# Patient Record
Sex: Female | Born: 2000 | Race: White | Hispanic: No | Marital: Single | Smoking: Current some day smoker
Health system: Southern US, Community
[De-identification: ages and names within clinical notes are randomized; demographics above are authoritative.]

## PROBLEM LIST (undated history)

## (undated) DIAGNOSIS — J302 Other seasonal allergic rhinitis: Secondary | ICD-10-CM

## (undated) DIAGNOSIS — R41840 Attention and concentration deficit: Secondary | ICD-10-CM

## (undated) HISTORY — PX: NO PAST SURGERIES: SHX2092

## (undated) HISTORY — PX: MOUTH SURGERY: SHX715

## (undated) HISTORY — DX: Other seasonal allergic rhinitis: J30.2

## (undated) HISTORY — DX: Attention and concentration deficit: R41.840

---
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
## 2020-04-27 ENCOUNTER — Other Ambulatory Visit: Payer: Self-pay | Admitting: Internal Medicine

## 2020-04-28 ENCOUNTER — Other Ambulatory Visit: Payer: Self-pay | Admitting: Internal Medicine

## 2020-05-04 ENCOUNTER — Other Ambulatory Visit: Payer: Self-pay | Admitting: Internal Medicine

## 2020-05-22 ENCOUNTER — Other Ambulatory Visit: Payer: Self-pay | Admitting: Internal Medicine

## 2020-05-25 ENCOUNTER — Other Ambulatory Visit: Payer: Self-pay | Admitting: Internal Medicine

## 2020-06-01 ENCOUNTER — Other Ambulatory Visit: Payer: Self-pay | Admitting: Internal Medicine

## 2020-06-02 ENCOUNTER — Ambulatory Visit: Payer: Self-pay | Admitting: Internal Medicine

## 2020-06-02 ENCOUNTER — Other Ambulatory Visit: Payer: Self-pay

## 2020-06-02 ENCOUNTER — Encounter: Payer: Self-pay | Admitting: Internal Medicine

## 2020-06-02 VITALS — BP 110/68 | HR 65 | Temp 98.2°F | Ht 66.75 in | Wt 148.4 lb

## 2020-06-02 DIAGNOSIS — Z79899 Other long term (current) drug therapy: Secondary | ICD-10-CM

## 2020-06-02 DIAGNOSIS — R41840 Attention and concentration deficit: Secondary | ICD-10-CM

## 2020-06-02 DIAGNOSIS — Z0000 Encounter for general adult medical examination without abnormal findings: Secondary | ICD-10-CM

## 2020-06-02 DIAGNOSIS — F419 Anxiety disorder, unspecified: Secondary | ICD-10-CM

## 2020-06-02 DIAGNOSIS — Z3041 Encounter for surveillance of contraceptive pills: Secondary | ICD-10-CM

## 2020-06-02 MED ORDER — LISDEXAMFETAMINE DIMESYLATE 10 MG PO CAPS: 10.0000 mg | ORAL_CAPSULE | Freq: Every day | ORAL | 0 refills | Status: DC

## 2020-06-02 MED ORDER — CITALOPRAM HYDROBROMIDE 10 MG PO TABS: 10.0000 mg | ORAL_TABLET | Freq: Every day | ORAL | 1 refills | Status: DC

## 2020-06-02 MED ORDER — DROSPIRENONE-ETHINYL ESTRADIOL 3-0.02 MG PO TABS: 1.0000 | ORAL_TABLET | Freq: Every day | ORAL | 3 refills | Status: DC

## 2020-06-02 MED ORDER — FLUTICASONE PROPIONATE 50 MCG/ACT NA SUSP: 2.0000 | Freq: Every day | NASAL | 2 refills | Status: DC

## 2020-06-02 NOTE — Progress Notes (Signed)
Chief Complaint  Patient presents with   Medication Management    Discuss med changes, wants 3 months Rx at a time   Annual Exam    HPI: Patient  Shelly Fox  19 y.o. comes in today for Preventive Health Care visit  And med check    ADD?  Would like to try 40 mg    30 not as effective .      Would like to try 40 mg  . School.  Beginning and to transfer to [employer] helped but was not as effective near end of last semester .  Denies sig se  Anxiety worry  Father asks her to address her anxiety chornic worry and  Some irrirability  Tends to get fearful at night  Someone may  Enter her abode   No hx of same  . Tends to have some anxieyt not sure about counseling  ( disc in past when evaluated by add eval ) working as  [employer] goes to gym still has hard time getting to sleep    Neg tad   Denise full panic attacks  Not suicidal   Refill ocps   Doing well nl periods  No sti concerns  Has perioroial rawsh at times using sis eczema cream? Not sure  What   Health Maintenance  Topic Date Due   Hepatitis C Screening  Never done   HIV Screening  Never done   TETANUS/TDAP  Never done   INFLUENZA VACCINE  06/28/2020   Health Maintenance Review LIFESTYLE:  Exercise:  Goes to gyme Tneg tad Sugar beverages: Sleep:sometimes  Less ocass ha  Drug use: no  Work: [employer]  In summer  soph in fall biology     ROS: ocass hasa  GEN/ HEENT: No fever, significant weight changes sweats  vision problems hearing changes, CV/ PULM; No chest pain shortness of breath cough, syncope,edema  change in exercise tolerance. GI /GU: No adominal pain, vomiting, change in bowel habits. No blood in the stool. No significant GU symptoms. SKIN/HEME: ,no acute skin rashes suspicious lesions or bleeding. No lymphadenopathy, nodules, masses.  NEURO/ PSYCH:  No neurologic signs such as weakness numbness. Not really depressed see hpi IMM/ Allergy: No unusual infections.  Allergy .   REST of 12 system  review negative except as per HPI   Past Medical History:  Diagnosis Date   Attention and concentration deficit    eval for add poss ocd and hyperfocus failed med trial in elem school   Seasonal allergies    rhinitis on meds     Past Surgical History:  Procedure Laterality Date   NO PAST SURGERIES      Family History  Problem Relation Age of Onset   Hyperlipidemia Father    Asthma Father    Allergies Father    Allergies Mother    Alcohol abuse Maternal Grandfather     Social History   Socioeconomic History   Marital status: Single    Spouse name: Not on file   Number of children: Not on file   Years of education: Not on file   Highest education level: Not on file  Occupational History   Occupation: Student  Tobacco Use   Smoking status: Never Smoker   Smokeless tobacco: Never Used  Vaping Use   Vaping Use: Former  Substance and Sexual Activity   Alcohol use: Yes    Alcohol/week: 0.0 standard drinks    Comment: Occassional   Drug  use: Never   Sexual activity: Not Currently  Other Topics Concern   Not on file  Social History Narrative   [employer] to go to [employer]   Parents divorced 2 HH   Parents james Robenson mzsers Press photographer rep aed and Fish farm manager college realestate   stepdad Marjory Lies Zseltvay  Step mom Andreika Vandagriff( a legal guardian reported)   Week trnasfers mom hh has 2 dogs net ets FA    Older sis   Social Determinants of Health   Financial Resource Strain:    Difficulty of Paying Living Expenses:   Food Insecurity:    Worried About Charity fundraiser in the Last Year:    Arboriculturist in the Last Year:   Transportation Needs:    Film/video editor (Medical):    Lack of Transportation (Non-Medical):   Physical Activity:    Days of Exercise per Week:    Minutes of Exercise per Session:   Stress:    Feeling of Stress :   Social Connections:    Frequency of Communication with Friends and Family:     Frequency of Social Gatherings with Friends and Family:    Attends Religious Services:    Active Member of Clubs or Organizations:    Attends Archivist Meetings:    Marital Status:     Outpatient Medications Prior to Visit  Medication Sig Dispense Refill   ibuprofen (ADVIL,MOTRIN) 800 MG tablet as needed.     cetirizine (ZYRTEC) 10 MG tablet Take 1 tablet (10 mg total) by mouth daily. Please schedule follow up visit for further refills. [phone] 30 tablet 0   drospirenone-ethinyl estradiol (NIKKI) 3-0.02 MG tablet Take 1 tablet by mouth daily. Please keep upcoming appointments for refills. 28 tablet 0   fluticasone (FLONASE) 50 MCG/ACT nasal spray INSTIL 1 SPRAY IN EACH NOSTRIL DAILY 1 g 2   lisdexamfetamine (VYVANSE) 30 MG capsule Take 1 capsule (30 mg total) by mouth daily. 90 capsule 0   No facility-administered medications prior to visit.     EXAM:  BP 110/68    Pulse 65    Temp 98.2 F (36.8 C) (Temporal)    Ht 5' 6.75" (1.695 m)    Wt 148 lb 6.4 oz (67.3 kg)    SpO2 98%    BMI 23.42 kg/m   Body mass index is 23.42 kg/m. Wt Readings from Last 3 Encounters:  06/02/20 148 lb 6.4 oz (67.3 kg) (80 %, Z= 0.84)*  05/29/19 145 lb (65.8 kg) (80 %, Z= 0.83)*  10/17/18 138 lb 11.2 oz (62.9 kg) (75 %, Z= 0.67)*   * Growth percentiles are based on CDC (Girls, 2-20 Years) data.    Physical Exam: Vital signs reviewed HER:DEYC is a well-developed well-nourished alert cooperative    who appearsr stated age in no acute distress.  HEENT: normocephalic atraumatic , Eyes: PERRL EOM's full, conjunctiva clear, Nares: paten,t no deformity discharge or tenderness., Ears: no deformity EAC's clear TMs with normal landmarks. Mouth: clear OP, masked NECK: supple without masses, thyromegaly or bruits. CHEST/PULM:  Clear to auscultation and percussion breath sounds equal no wheeze , rales or rhonchi. No chest wall deformities or tenderness. Breast: normal by inspection . No  dimpling, discharge, masses, tenderness or discharge . CV: PMI is nondisplaced, S1 S2 no gallops, murmurs, rubs. Peripheral pulses are full without delay.No JVD .  ABDOMEN: Bowel sounds normal nontender  No guard or rebound, no hepato splenomegal no CVA tenderness.  Extremtities:  No clubbing cyanosis or edema, no acute joint swelling or redness no focal atrophy NEURO:  Oriented x3, cranial nerves 3-12 appear to be intact, no obvious focal weakness,gait within normal limits no abnormal reflexes or asymmetrical SKIN: No acute rashes normal turgor, color, no bruising or petechiae. Few papules around  Mouth nl area no scaling  PSYCH: Oriented, good eye contact, no obvious depression anxiety, cognition and judgment appear normal. Nl speech  LN: no cervical axillary inguinal adenopathy  Lab Results  Component Value Date   WBC 7.5 05/29/2019   HGB 12.6 05/29/2019   HCT 37.7 05/29/2019   PLT 246.0 05/29/2019   GLUCOSE 83 05/29/2019   CHOL 159 05/29/2019   TRIG 125.0 05/29/2019   HDL 53.90 05/29/2019   LDLCALC 80 05/29/2019   ALT 15 05/29/2019   AST 29 05/29/2019   NA 138 05/29/2019   K 4.6 05/29/2019   CL 104 05/29/2019   CREATININE 0.95 05/29/2019   BUN 12 05/29/2019   CO2 26 05/29/2019   TSH 2.12 05/29/2019    BP Readings from Last 3 Encounters:  06/02/20 110/68  05/29/19 110/60  10/17/18 (!) 102/64      ASSESSMENT AND PLAN:  Discussed the following assessment and plan:    ICD-10-CM   1. Visit for preventive health examination  Z00.00   2. Medication management  Z79.899   3. Attention and concentration deficit  R41.840   4. Anxiety disorder, unspecified type  F41.9   5. Oral contraceptive use  Z30.41    anxiety   A problem still consider  medication   Family    counseling at some point has limited insurance at this time  Disc counseling [facility] in future  Plan add 10 citalopram  And VV fu in about 4 weeks   Can try adding 10 of vyvanse to the 30 total 40 mg dose  ing  May  benefit when at work  Only add one med at a time. Refill ocps and flonase  hcm seems utd  ( ?last td)  Not in our records  But assume utd as  Passes immuniz clearance at college and hs)  No follow-ups on file.  Patient Care Team: Geniya Fulgham, Standley Brooking, MD as PCP - General (Internal Medicine) Patient Instructions  consider  Adding med and counseling about   Anxiety .  Anxiety Can effect sleep etc.  Can add  Low dose medication for  The anxiety  May help sleep eventually.   Citalopram  And fu in 4 weeks   Can try  vyvanse  40 mg equivalent .   10 mg   Add on to try   Sleep is important for  Mood and other .           Generalized Anxiety Disorder, Adult Generalized anxiety disorder (GAD) is a mental health disorder. People with this condition constantly worry about everyday events. Unlike normal anxiety, worry related to GAD is not triggered by a specific event. These worries also do not fade or get better with time. GAD interferes with life functions, including relationships, work, and school. GAD can vary from mild to severe. People with severe GAD can have intense waves of anxiety with physical symptoms (panic attacks). What are the causes? The exact cause of GAD is not known. What increases the risk? This condition is more likely to develop in:  Women.  People who have a family history of anxiety disorders.  People who are very shy.  People who  experience very stressful life events, such as the death of a loved one.  People who have a very stressful family environment. What are the signs or symptoms? People with GAD often worry excessively about many things in their lives, such as their health and family. They may also be overly concerned about:  Doing well at work.  Being on time.  Natural disasters.  Friendships. Physical symptoms of GAD include:  Fatigue.  Muscle tension or having muscle twitches.  Trembling or feeling shaky.  Being easily  startled.  Feeling like your heart is pounding or racing.  Feeling out of breath or like you cannot take a deep breath.  Having trouble falling asleep or staying asleep.  Sweating.  Nausea, diarrhea, or irritable bowel syndrome (IBS).  Headaches.  Trouble concentrating or remembering facts.  Restlessness.  Irritability. How is this diagnosed? Your health care provider can diagnose GAD based on your symptoms and medical history. You will also have a physical exam. The health care provider will ask specific questions about your symptoms, including how severe they are, when they started, and if they come and go. Your health care provider may ask you about your use of alcohol or drugs, including prescription medicines. Your health care provider may refer you to a mental health specialist for further evaluation. Your health care provider will do a thorough examination and may perform additional tests to rule out other possible causes of your symptoms. To be diagnosed with GAD, a person must have anxiety that:  Is out of his or her control.  Affects several different aspects of his or her life, such as work and relationships.  Causes distress that makes him or her unable to take part in normal activities.  Includes at least three physical symptoms of GAD, such as restlessness, fatigue, trouble concentrating, irritability, muscle tension, or sleep problems. Before your health care provider can confirm a diagnosis of GAD, these symptoms must be present more days than they are not, and they must last for six months or longer. How is this treated? The following therapies are usually used to treat GAD:  Medicine. Antidepressant medicine is usually prescribed for long-term daily control. Antianxiety medicines may be added in severe cases, especially when panic attacks occur.  Talk therapy (psychotherapy). Certain types of talk therapy can be helpful in treating GAD by providing support,  education, and guidance. Options include: ? Cognitive behavioral therapy (CBT). People learn coping skills and techniques to ease their anxiety. They learn to identify unrealistic or negative thoughts and behaviors and to replace them with positive ones. ? Acceptance and commitment therapy (ACT). This treatment teaches people how to be mindful as a way to cope with unwanted thoughts and feelings. ? Biofeedback. This process trains you to manage your body's response (physiological response) through breathing techniques and relaxation methods. You will work with a therapist while machines are used to monitor your physical symptoms.  Stress management techniques. These include yoga, meditation, and exercise. A mental health specialist can help determine which treatment is best for you. Some people see improvement with one type of therapy. However, other people require a combination of therapies. Follow these instructions at home:  Take over-the-counter and prescription medicines only as told by your health care provider.  Try to maintain a normal routine.  Try to anticipate stressful situations and allow extra time to manage them.  Practice any stress management or self-calming techniques as taught by your health care provider.  Do not punish yourself for  setbacks or for not making progress.  Try to recognize your accomplishments, even if they are small.  Keep all follow-up visits as told by your health care provider. This is important. Contact a health care provider if:  Your symptoms do not get better.  Your symptoms get worse.  You have signs of depression, such as: ? A persistently sad, cranky, or irritable mood. ? Loss of enjoyment in activities that used to bring you joy. ? Change in weight or eating. ? Changes in sleeping habits. ? Avoiding friends or family members. ? Loss of energy for normal tasks. ? Feelings of guilt or worthlessness. Get help right away if:  You have  serious thoughts about hurting yourself or others. If you ever feel like you may hurt yourself or others, or have thoughts about taking your own life, get help right away. You can go to your nearest emergency department or call:  Your local emergency services (911 in the [place]  A suicide crisis helpline, such as the [place] at 931-075-1319. This is open 24 hours a day. Summary  Generalized anxiety disorder (GAD) is a mental health disorder that involves worry that is not triggered by a specific event.  People with GAD often worry excessively about many things in their lives, such as their health and family.  GAD may cause physical symptoms such as restlessness, trouble concentrating, sleep problems, frequent sweating, nausea, diarrhea, headaches, and trembling or muscle twitching.  A mental health specialist can help determine which treatment is best for you. Some people see improvement with one type of therapy. However, other people require a combination of therapies. This information is not intended to replace advice given to you by your health care provider. Make sure you discuss any questions you have with your health care provider. Document Revised: 10/27/2017 Document Reviewed: 10/04/2016 Elsevier Patient Education  2020 [place] 59-9 Years Old, Female Preventive care refers to lifestyle choices and visits with your health care provider that can promote health and wellness. At this stage in your life, you may start seeing a primary care physician instead of a pediatrician. Your health care is now your responsibility. Preventive care for young adults includes:  A yearly physical exam. This is also called an annual wellness visit.  Regular dental and eye exams.  Immunizations.  Screening for certain conditions.  Healthy lifestyle choices, such as diet and exercise. What can I expect for my preventive care visit? Physical  exam Your health care provider may check:  Height and weight. These may be used to calculate body mass index (BMI), which is a measurement that tells if you are at a healthy weight.  Heart rate and blood pressure.  Body temperature. Counseling Your health care provider may ask you questions about:  Past medical problems and family medical history.  Alcohol, tobacco, and drug use.  Home and relationship well-being.  Access to firearms.  Emotional well-being.  Diet, exercise, and sleep habits.  Sexual activity and sexual health.  Method of birth control.  Menstrual cycle.  Pregnancy history. What immunizations do I need?  Influenza (flu) vaccine  This is recommended every year. Tetanus, diphtheria, and pertussis (Tdap) vaccine  You may need a Td booster every 10 years. Varicella (chickenpox) vaccine  You may need this vaccine if you have not already been vaccinated. Human papillomavirus (HPV) vaccine  If recommended by your health care provider, you may need three doses over 6 months. Measles, mumps,  and rubella (MMR) vaccine  You may need at least one dose of MMR. You may also need a second dose. Meningococcal conjugate (MenACWY) vaccine  One dose is recommended if you are 18-23 years old and a Market researcher living in a residence hall, or if you have one of several medical conditions. You may also need additional booster doses. Pneumococcal conjugate (PCV13) vaccine  You may need this if you have certain conditions and were not previously vaccinated. Pneumococcal polysaccharide (PPSV23) vaccine  You may need one or two doses if you smoke cigarettes or if you have certain conditions. Hepatitis A vaccine  You may need this if you have certain conditions or if you travel or work in places where you may be exposed to hepatitis A. Hepatitis B vaccine  You may need this if you have certain conditions or if you travel or work in places where you may  be exposed to hepatitis B. Haemophilus influenzae type b (Hib) vaccine  You may need this if you have certain risk factors. You may receive vaccines as individual doses or as more than one vaccine together in one shot (combination vaccines). Talk with your health care provider about the risks and benefits of combination vaccines. What tests do I need? Blood tests  Lipid and cholesterol levels. These may be checked every 5 years starting at age 3.  Hepatitis C test.  Hepatitis B test. Screening  Pelvic exam and Pap test. This may be done every 3 years starting at age 46.  Sexually transmitted disease (STD) testing, if you are at risk.  BRCA-related cancer screening. This may be done if you have a family history of breast, ovarian, tubal, or peritoneal cancers. Other tests  Tuberculosis skin test.  Vision and hearing tests.  Skin exam.  Breast exam. Follow these instructions at home: Eating and drinking   Eat a diet that includes fresh fruits and vegetables, whole grains, lean protein, and low-fat dairy products.  Drink enough fluid to keep your urine pale yellow.  Do not drink alcohol if: ? Your health care provider tells you not to drink. ? You are pregnant, may be pregnant, or are planning to become pregnant. ? You are under the legal drinking age. In the [place] the legal drinking age is 41.  If you drink alcohol: ? Limit how much you have to 0-1 drink a day. ? Be aware of how much alcohol is in your drink. In the [place] one drink equals one 12 oz bottle of beer (355 mL), one 5 oz glass of wine (148 mL), or one 1 oz glass of hard liquor (44 mL). Lifestyle  Take daily care of your teeth and gums.  Stay active. Exercise at least 30 minutes 5 or more days of the week.  Do not use any products that contain nicotine or tobacco, such as cigarettes, e-cigarettes, and chewing tobacco. If you need help quitting, ask your health care provider.  Do not use drugs.  If you  are sexually active, practice safe sex. Use a condom or other form of birth control (contraception) in order to prevent pregnancy and STIs (sexually transmitted infections). If you plan to become pregnant, see your health care provider for a pre-conception visit.  Find healthy ways to cope with stress, such as: ? Meditation, yoga, or listening to music. ? Journaling. ? Talking to a trusted person. ? Spending time with friends and family. Safety  Always wear your seat belt while driving or riding in a  vehicle.  Do not drive if you have been drinking alcohol. Do not ride with someone who has been drinking.  Do not drive when you are tired or distracted. Do not text while driving.  Wear a helmet and other protective equipment during sports activities.  If you have firearms in your house, make sure you follow all gun safety procedures.  Seek help if you have been bullied, physically abused, or sexually abused.  Use the Internet responsibly to avoid dangers such as online bullying and online sex predators. What's next?  Go to your health care provider once a year for a well check visit.  Ask your health care provider how often you should have your eyes and teeth checked.  Stay up to date on all vaccines. This information is not intended to replace advice given to you by your health care provider. Make sure you discuss any questions you have with your health care provider. Document Revised: 11/08/2018 Document Reviewed: 11/08/2018 Elsevier Patient Education  2020 [place] Marysol Wellnitz M.D.

## 2020-06-02 NOTE — Patient Instructions (Addendum)
consider  Adding med and counseling about   Anxiety .  Anxiety Can effect sleep etc.  Can add  Low dose medication for  The anxiety  May help sleep eventually.   Citalopram  And fu in 4 weeks   Can try  vyvanse  40 mg equivalent .   10 mg   Add on to try   Sleep is important for  Mood and other .           Generalized Anxiety Disorder, Adult Generalized anxiety disorder (GAD) is a mental health disorder. People with this condition constantly worry about everyday events. Unlike normal anxiety, worry related to GAD is not triggered by a specific event. These worries also do not fade or get better with time. GAD interferes with life functions, including relationships, work, and school. GAD can vary from mild to severe. People with severe GAD can have intense waves of anxiety with physical symptoms (panic attacks). What are the causes? The exact cause of GAD is not known. What increases the risk? This condition is more likely to develop in:  Women.  People who have a family history of anxiety disorders.  People who are very shy.  People who experience very stressful life events, such as the death of a loved one.  People who have a very stressful family environment. What are the signs or symptoms? People with GAD often worry excessively about many things in their lives, such as their health and family. They may also be overly concerned about:  Doing well at work.  Being on time.  Natural disasters.  Friendships. Physical symptoms of GAD include:  Fatigue.  Muscle tension or having muscle twitches.  Trembling or feeling shaky.  Being easily startled.  Feeling like your heart is pounding or racing.  Feeling out of breath or like you cannot take a deep breath.  Having trouble falling asleep or staying asleep.  Sweating.  Nausea, diarrhea, or irritable bowel syndrome (IBS).  Headaches.  Trouble concentrating or remembering  facts.  Restlessness.  Irritability. How is this diagnosed? Your health care provider can diagnose GAD based on your symptoms and medical history. You will also have a physical exam. The health care provider will ask specific questions about your symptoms, including how severe they are, when they started, and if they come and go. Your health care provider may ask you about your use of alcohol or drugs, including prescription medicines. Your health care provider may refer you to a mental health specialist for further evaluation. Your health care provider will do a thorough examination and may perform additional tests to rule out other possible causes of your symptoms. To be diagnosed with GAD, a person must have anxiety that:  Is out of his or her control.  Affects several different aspects of his or her life, such as work and relationships.  Causes distress that makes him or her unable to take part in normal activities.  Includes at least three physical symptoms of GAD, such as restlessness, fatigue, trouble concentrating, irritability, muscle tension, or sleep problems. Before your health care provider can confirm a diagnosis of GAD, these symptoms must be present more days than they are not, and they must last for six months or longer. How is this treated? The following therapies are usually used to treat GAD:  Medicine. Antidepressant medicine is usually prescribed for long-term daily control. Antianxiety medicines may be added in severe cases, especially when panic attacks occur.  Talk therapy (psychotherapy). Certain types  of talk therapy can be helpful in treating GAD by providing support, education, and guidance. Options include: ? Cognitive behavioral therapy (CBT). People learn coping skills and techniques to ease their anxiety. They learn to identify unrealistic or negative thoughts and behaviors and to replace them with positive ones. ? Acceptance and commitment therapy (ACT).  This treatment teaches people how to be mindful as a way to cope with unwanted thoughts and feelings. ? Biofeedback. This process trains you to manage your body's response (physiological response) through breathing techniques and relaxation methods. You will work with a therapist while machines are used to monitor your physical symptoms.  Stress management techniques. These include yoga, meditation, and exercise. A mental health specialist can help determine which treatment is best for you. Some people see improvement with one type of therapy. However, other people require a combination of therapies. Follow these instructions at home:  Take over-the-counter and prescription medicines only as told by your health care provider.  Try to maintain a normal routine.  Try to anticipate stressful situations and allow extra time to manage them.  Practice any stress management or self-calming techniques as taught by your health care provider.  Do not punish yourself for setbacks or for not making progress.  Try to recognize your accomplishments, even if they are small.  Keep all follow-up visits as told by your health care provider. This is important. Contact a health care provider if:  Your symptoms do not get better.  Your symptoms get worse.  You have signs of depression, such as: ? A persistently sad, cranky, or irritable mood. ? Loss of enjoyment in activities that used to bring you joy. ? Change in weight or eating. ? Changes in sleeping habits. ? Avoiding friends or family members. ? Loss of energy for normal tasks. ? Feelings of guilt or worthlessness. Get help right away if:  You have serious thoughts about hurting yourself or others. If you ever feel like you may hurt yourself or others, or have thoughts about taking your own life, get help right away. You can go to your nearest emergency department or call:  Your local emergency services (911 in the [place]  A suicide crisis  helpline, such as the [facility] at 215-353-0249. This is open 24 hours a day. Summary  Generalized anxiety disorder (GAD) is a mental health disorder that involves worry that is not triggered by a specific event.  People with GAD often worry excessively about many things in their lives, such as their health and family.  GAD may cause physical symptoms such as restlessness, trouble concentrating, sleep problems, frequent sweating, nausea, diarrhea, headaches, and trembling or muscle twitching.  A mental health specialist can help determine which treatment is best for you. Some people see improvement with one type of therapy. However, other people require a combination of therapies. This information is not intended to replace advice given to you by your health care provider. Make sure you discuss any questions you have with your health care provider. Document Revised: 10/27/2017 Document Reviewed: 10/04/2016 Elsevier Patient Education  2020 [place] 38-48 Years Old, Female Preventive care refers to lifestyle choices and visits with your health care provider that can promote health and wellness. At this stage in your life, you may start seeing a primary care physician instead of a pediatrician. Your health care is now your responsibility. Preventive care for young adults includes:  A yearly physical exam. This is also called  an annual wellness visit.  Regular dental and eye exams.  Immunizations.  Screening for certain conditions.  Healthy lifestyle choices, such as diet and exercise. What can I expect for my preventive care visit? Physical exam Your health care provider may check:  Height and weight. These may be used to calculate body mass index (BMI), which is a measurement that tells if you are at a healthy weight.  Heart rate and blood pressure.  Body temperature. Counseling Your health care provider may ask you questions  about:  Past medical problems and family medical history.  Alcohol, tobacco, and drug use.  Home and relationship well-being.  Access to firearms.  Emotional well-being.  Diet, exercise, and sleep habits.  Sexual activity and sexual health.  Method of birth control.  Menstrual cycle.  Pregnancy history. What immunizations do I need?  Influenza (flu) vaccine  This is recommended every year. Tetanus, diphtheria, and pertussis (Tdap) vaccine  You may need a Td booster every 10 years. Varicella (chickenpox) vaccine  You may need this vaccine if you have not already been vaccinated. Human papillomavirus (HPV) vaccine  If recommended by your health care provider, you may need three doses over 6 months. Measles, mumps, and rubella (MMR) vaccine  You may need at least one dose of MMR. You may also need a second dose. Meningococcal conjugate (MenACWY) vaccine  One dose is recommended if you are 59-89 years old and a Market researcher living in a residence hall, or if you have one of several medical conditions. You may also need additional booster doses. Pneumococcal conjugate (PCV13) vaccine  You may need this if you have certain conditions and were not previously vaccinated. Pneumococcal polysaccharide (PPSV23) vaccine  You may need one or two doses if you smoke cigarettes or if you have certain conditions. Hepatitis A vaccine  You may need this if you have certain conditions or if you travel or work in places where you may be exposed to hepatitis A. Hepatitis B vaccine  You may need this if you have certain conditions or if you travel or work in places where you may be exposed to hepatitis B. Haemophilus influenzae type b (Hib) vaccine  You may need this if you have certain risk factors. You may receive vaccines as individual doses or as more than one vaccine together in one shot (combination vaccines). Talk with your health care provider about the risks and  benefits of combination vaccines. What tests do I need? Blood tests  Lipid and cholesterol levels. These may be checked every 5 years starting at age 58.  Hepatitis C test.  Hepatitis B test. Screening  Pelvic exam and Pap test. This may be done every 3 years starting at age 1.  Sexually transmitted disease (STD) testing, if you are at risk.  BRCA-related cancer screening. This may be done if you have a family history of breast, ovarian, tubal, or peritoneal cancers. Other tests  Tuberculosis skin test.  Vision and hearing tests.  Skin exam.  Breast exam. Follow these instructions at home: Eating and drinking   Eat a diet that includes fresh fruits and vegetables, whole grains, lean protein, and low-fat dairy products.  Drink enough fluid to keep your urine pale yellow.  Do not drink alcohol if: ? Your health care provider tells you not to drink. ? You are pregnant, may be pregnant, or are planning to become pregnant. ? You are under the legal drinking age. In the [place] the legal drinking age is  21.  If you drink alcohol: ? Limit how much you have to 0-1 drink a day. ? Be aware of how much alcohol is in your drink. In the [place] one drink equals one 12 oz bottle of beer (355 mL), one 5 oz glass of wine (148 mL), or one 1 oz glass of hard liquor (44 mL). Lifestyle  Take daily care of your teeth and gums.  Stay active. Exercise at least 30 minutes 5 or more days of the week.  Do not use any products that contain nicotine or tobacco, such as cigarettes, e-cigarettes, and chewing tobacco. If you need help quitting, ask your health care provider.  Do not use drugs.  If you are sexually active, practice safe sex. Use a condom or other form of birth control (contraception) in order to prevent pregnancy and STIs (sexually transmitted infections). If you plan to become pregnant, see your health care provider for a pre-conception visit.  Find healthy ways to cope with  stress, such as: ? Meditation, yoga, or listening to music. ? Journaling. ? Talking to a trusted person. ? Spending time with friends and family. Safety  Always wear your seat belt while driving or riding in a vehicle.  Do not drive if you have been drinking alcohol. Do not ride with someone who has been drinking.  Do not drive when you are tired or distracted. Do not text while driving.  Wear a helmet and other protective equipment during sports activities.  If you have firearms in your house, make sure you follow all gun safety procedures.  Seek help if you have been bullied, physically abused, or sexually abused.  Use the Internet responsibly to avoid dangers such as online bullying and online sex predators. What's next?  Go to your health care provider once a year for a well check visit.  Ask your health care provider how often you should have your eyes and teeth checked.  Stay up to date on all vaccines. This information is not intended to replace advice given to you by your health care provider. Make sure you discuss any questions you have with your health care provider. Document Revised: 11/08/2018 Document Reviewed: 11/08/2018 Elsevier Patient Education  2020 [employer].

## 2020-06-30 ENCOUNTER — Other Ambulatory Visit: Payer: Self-pay

## 2020-07-01 MED ORDER — CETIRIZINE HCL 10 MG PO TABS: 10.0000 mg | ORAL_TABLET | Freq: Every day | ORAL | 0 refills | Status: DC

## 2020-07-06 ENCOUNTER — Encounter: Payer: Self-pay | Admitting: Internal Medicine

## 2020-07-06 ENCOUNTER — Telehealth (INDEPENDENT_AMBULATORY_CARE_PROVIDER_SITE_OTHER): Payer: Self-pay | Admitting: Internal Medicine

## 2020-07-06 ENCOUNTER — Other Ambulatory Visit: Payer: Self-pay

## 2020-07-06 VITALS — Ht 66.75 in | Wt 145.0 lb

## 2020-07-06 DIAGNOSIS — F419 Anxiety disorder, unspecified: Secondary | ICD-10-CM

## 2020-07-06 DIAGNOSIS — Z79899 Other long term (current) drug therapy: Secondary | ICD-10-CM

## 2020-07-06 DIAGNOSIS — R41840 Attention and concentration deficit: Secondary | ICD-10-CM

## 2020-07-06 MED ORDER — CITALOPRAM HYDROBROMIDE 10 MG PO TABS: 10.0000 mg | ORAL_TABLET | Freq: Every day | ORAL | 1 refills | Status: DC

## 2020-07-06 NOTE — Progress Notes (Signed)
Virtual Visit via Video Note  I connected with@ on 07/06/20 at  9:00 AM EDT by a video enabled telemedicine application and verified that I am speaking with the correct person using two identifiers. Location patient: home Location provider:work  office Persons participating in the virtual visit: patient, provider  [place] national recommendations  regarding COVID 19 pandemic   video visit is advised over in office visit for this patient.  Patient aware  of the limitations of evaluation and management by telemedicine and  availability of in person appointments. and agreed to proceed.   HPI: Shelly Fox presents for video visit  Has begun citalopram 10 mg and she and family feels has been helping a good bit  Sleeping better and not as irritable andanxisous  No panic attacks  stoppe ft woprk last weekk To  Move in school in next week [employer]   Not taking vyvanse at this time but plans on taking  At school or "when needed" needng to get a lot of stugg done.   Would try the 30 mg first and then inc to 40 mg  As trial as we discussed last visit    ROS: See pertinent positives and negatives per HPI.  Past Medical History:  Diagnosis Date  . Attention and concentration deficit    eval for add poss ocd and hyperfocus failed med trial in elem school  . Seasonal allergies    rhinitis on meds     Past Surgical History:  Procedure Laterality Date  . NO PAST SURGERIES      Family History  Problem Relation Age of Onset  . Hyperlipidemia Father   . Asthma Father   . Allergies Father   . Allergies Mother   . Alcohol abuse Maternal Grandfather     Social History   Tobacco Use  . Smoking status: Never Smoker  . Smokeless tobacco: Never Used  Vaping Use  . Vaping Use: Former  Substance Use Topics  . Alcohol use: Yes    Alcohol/week: 0.0 standard drinks    Comment: Occassional  . Drug use: Never      Current Outpatient Medications:  .  cetirizine (ZYRTEC) 10 MG tablet, Take  1 tablet (10 mg total) by mouth daily., Disp: 90 tablet, Rfl: 0 .  citalopram (CELEXA) 10 MG tablet, Take 1 tablet (10 mg total) by mouth daily., Disp: 90 tablet, Rfl: 1 .  drospirenone-ethinyl estradiol (NIKKI) 3-0.02 MG tablet, Take 1 tablet by mouth daily., Disp: 84 tablet, Rfl: 3 .  fluticasone (FLONASE) 50 MCG/ACT nasal spray, Place 2 sprays into both nostrils daily., Disp: 16 g, Rfl: 2 .  ibuprofen (ADVIL,MOTRIN) 800 MG tablet, as needed., Disp: , Rfl:  .  lisdexamfetamine (VYVANSE) 10 MG capsule, Take 1 capsule (10 mg total) by mouth daily. With 30 mg total 40 mg, Disp: 30 capsule, Rfl: 0 .  lisdexamfetamine (VYVANSE) 30 MG capsule, Take 1 capsule (30 mg total) by mouth daily., Disp: 90 capsule, Rfl: 0  EXAM: BP Readings from Last 3 Encounters:  06/02/20 110/68  05/29/19 110/60  10/17/18 (!) 102/64    GENERAL: alert, oriented, appears well and in no acute distress  HEENT: atraumatic, conjunttiva clear, no obvious abnormalities on inspection of external nose and ears  NECK: normal movements of the head and neck  LUNGS: on inspection no signs of respiratory distress, breathing rate appears normal, no obvious gross SOB, gasping or wheezing  CV: no obvious cyanosis  MS: moves all visible extremities without noticeable abnormality  PSYCH/NEURO: pleasant and cooperative, no obvious depression or anxiety, speech and thought processing grossly intact   ASSESSMENT AND PLAN:  Discussed the following assessment and plan:    ICD-10-CM   1. Anxiety disorder, unspecified type  F41.9    doing better on medication   2. Medication management  Z79.899   3. Attention and concentration deficit  R41.840    medicaion helpful  Stay at same dose  10 mg    Can refill at 90 days  When due . Ok to take the vyvanse 30  And add on 10 as trial if needed during school year (lock up meds ) send [place] progress about how doing   Plan fu visit vv or other in 3-6 months or sooner if needed  consider  checking into student health insurance if  Better coverage and pricing  Counseled.  Good luck in school   Expectant management and discussion of plan and treatment with opportunity to ask questions and all were answered. The patient agreed with the plan and demonstrated an understanding of the instructions.   Advised to call back or seek an in-person evaluation if worsening  or having  further concerns . Return for 3-6 months or as indicated  med check .   Berniece Andreas, MD

## 2020-07-30 ENCOUNTER — Other Ambulatory Visit: Payer: Self-pay | Admitting: Internal Medicine

## 2020-07-30 ENCOUNTER — Other Ambulatory Visit: Payer: Self-pay | Admitting: *Deleted

## 2020-07-30 DIAGNOSIS — F419 Anxiety disorder, unspecified: Secondary | ICD-10-CM

## 2020-07-30 MED ORDER — CITALOPRAM HYDROBROMIDE 10 MG PO TABS: 10.0000 mg | ORAL_TABLET | Freq: Every day | ORAL | 1 refills | Status: DC

## 2020-07-31 ENCOUNTER — Other Ambulatory Visit: Payer: Self-pay

## 2020-07-31 ENCOUNTER — Telehealth: Payer: Self-pay | Admitting: Internal Medicine

## 2020-07-31 DIAGNOSIS — F419 Anxiety disorder, unspecified: Secondary | ICD-10-CM

## 2020-07-31 MED ORDER — CITALOPRAM HYDROBROMIDE 10 MG PO TABS: 10.0000 mg | ORAL_TABLET | Freq: Every day | ORAL | 1 refills | Status: DC

## 2020-07-31 NOTE — Telephone Encounter (Signed)
Cali from [facility] pharmacy is saying they have no record for the pt's medication   citalopram (CELEXA) 10 MG tablet   To please resend or call the prescription in   [facility] [address], [place] - [address]  [address] [place], [place] [address]  Phone:  [phone] Fax:  [phone]   Please advise

## 2020-07-31 NOTE — Telephone Encounter (Signed)
Medication looks like it was sent yesterday to the pharmacy but upon further inspection Asher Muir had it marked as fill later and not on the normal to send electronically.  I have just sent this medication to the pharmacy the correct way.

## 2020-08-31 ENCOUNTER — Telehealth: Payer: Self-pay | Admitting: Internal Medicine

## 2020-08-31 MED ORDER — LISDEXAMFETAMINE DIMESYLATE 40 MG PO CAPS: 40.0000 mg | ORAL_CAPSULE | ORAL | 0 refills | Status: DC

## 2020-08-31 NOTE — Addendum Note (Signed)
Addended byBerniece Andreas K on: 08/31/2020 01:27 PM   Modules accepted: Orders

## 2020-08-31 NOTE — Telephone Encounter (Addendum)
I sent in  40 mg vyvanse .  Let us know  How helping  Before runs out Can make virtual for med check

## 2020-08-31 NOTE — Telephone Encounter (Signed)
Last OV was 07/06/2020

## 2020-08-31 NOTE — Telephone Encounter (Signed)
Patient needs a refill on lisdexamfetamine (VYVANSE) 30 MG capsule   She would like to increase to 40 mg and a 3 months supply sent to the pharmacy since she's I college  [facility] [address], [place] - [address] Phone:  [phone]  Fax:  [phone]

## 2020-09-02 NOTE — Telephone Encounter (Signed)
Called patient and she stated that the 40 mg is working very well for her and she will call back to schedule her virtual med management appointment.

## 2020-09-16 ENCOUNTER — Telehealth (INDEPENDENT_AMBULATORY_CARE_PROVIDER_SITE_OTHER): Payer: Self-pay | Admitting: Internal Medicine

## 2020-09-16 ENCOUNTER — Encounter: Payer: Self-pay | Admitting: Internal Medicine

## 2020-09-16 DIAGNOSIS — Z3041 Encounter for surveillance of contraceptive pills: Secondary | ICD-10-CM

## 2020-09-16 DIAGNOSIS — R41840 Attention and concentration deficit: Secondary | ICD-10-CM

## 2020-09-16 DIAGNOSIS — Z79899 Other long term (current) drug therapy: Secondary | ICD-10-CM

## 2020-09-16 DIAGNOSIS — F419 Anxiety disorder, unspecified: Secondary | ICD-10-CM

## 2020-09-16 NOTE — Progress Notes (Signed)
Virtual Visit via Video Note  I connected with@ on 09/16/20 at 11:30 AM EDT by a video enabled telemedicine application and verified that I am speaking with the correct person using two identifiers. Location patient: home school  Location provider:work  office Persons participating in the virtual visit: patient, provider  [place] national recommendations  regarding COVID 19 pandemic   video visit is advised over in office visit for this patient.  Patient aware  of the limitations of evaluation and management by telemedicine and  availability of in person appointments. and agreed to proceed.   HPI: Shelly Fox presents for video visit  Fu  meds   Feels doing much better   Anxiety    Med has been helpful for thinking and irritability  School    Intense but doing well likes her classes   Add sx  Helping a lot   Helping  With the vyvanse 40 mg  No sig untoward se  Sleep ok    Not as much time exercising but ok.    ROS: See pertinent positives and negatives per HPI.  Past Medical History:  Diagnosis Date  . Attention and concentration deficit    eval for add poss ocd and hyperfocus failed med trial in elem school  . Seasonal allergies    rhinitis on meds     Past Surgical History:  Procedure Laterality Date  . NO PAST SURGERIES      Family History  Problem Relation Age of Onset  . Hyperlipidemia Father   . Asthma Father   . Allergies Father   . Allergies Mother   . Alcohol abuse Maternal Grandfather     Social History   Tobacco Use  . Smoking status: Never Smoker  . Smokeless tobacco: Never Used  Vaping Use  . Vaping Use: Former  Substance Use Topics  . Alcohol use: Yes    Alcohol/week: 0.0 standard drinks    Comment: Occassional  . Drug use: Never      Current Outpatient Medications:  .  cetirizine (ZYRTEC) 10 MG tablet, Take 1 tablet (10 mg total) by mouth daily., Disp: 90 tablet, Rfl: 0 .  citalopram (CELEXA) 10 MG tablet, Take 1 tablet (10 mg total) by  mouth daily., Disp: 90 tablet, Rfl: 1 .  drospirenone-ethinyl estradiol (NIKKI) 3-0.02 MG tablet, Take 1 tablet by mouth daily., Disp: 84 tablet, Rfl: 3 .  fluticasone (FLONASE) 50 MCG/ACT nasal spray, Place 2 sprays into both nostrils daily., Disp: 16 g, Rfl: 2 .  ibuprofen (ADVIL,MOTRIN) 800 MG tablet, as needed., Disp: , Rfl:  .  lisdexamfetamine (VYVANSE) 40 MG capsule, Take 1 capsule (40 mg total) by mouth every morning., Disp: 90 capsule, Rfl: 0  EXAM: BP Readings from Last 3 Encounters:  06/02/20 110/68  05/29/19 110/60  10/17/18 (!) 102/64    VITALS per patient if applicable:  GENERAL: alert, oriented, appears well and in no acute distress HEENT: atraumatic, conjunttiva clear, no obvious abnormalities on inspection of external nose and ears NECK: normal movements of the head and neck LUNGS: on inspection no signs of respiratory distress, breathing rate appears normal, no obvious gross SOB, gasping or wheezing CV: no obvious cyanosisMS: moves all visible extremities without noticeable abnormality  PSYCH/NEURO: pleasant and cooperative, no obvious depression or anxiety, speech and thought processing grossly intact   ASSESSMENT AND PLAN:  Discussed the following assessment and plan:    ICD-10-CM   1. Medication management  Z79.899   2. Anxiety disorder, unspecified type  F41.9   3. Attention and concentration deficit  R41.840   4. Oral contraceptive pill surveillance  Z30.41    Doing quite well  Now   Continue meds    Counseled. Healthy  lsi Disc ocps and mood effect doubt significant   Effect for her in this situation   Since doing well was on for irreg periods and bleeding  And no contraceptive need for now  Would wait to consider   Trying off and benefit more than risk at this time  Expectant management and discussion of plan and treatment with opportunity to ask questions and all were answered. The patient agreed with the plan and demonstrated an understanding of the  instructions.   Advised to call back or seek an in-person evaluation if worsening  or having  further concerns . Return in about 6 months (around 03/17/2021) for medication or as needed    cpx next summer .    Berniece Andreas, MD

## 2020-09-30 ENCOUNTER — Other Ambulatory Visit: Payer: Self-pay

## 2020-10-02 MED ORDER — CETIRIZINE HCL 10 MG PO TABS
10.0000 mg | ORAL_TABLET | Freq: Every day | ORAL | 0 refills | Status: DC
Start: 2020-10-02 — End: 2020-12-03

## 2020-12-03 ENCOUNTER — Other Ambulatory Visit: Payer: Self-pay

## 2020-12-05 ENCOUNTER — Other Ambulatory Visit: Payer: Self-pay | Admitting: Internal Medicine

## 2020-12-05 DIAGNOSIS — F419 Anxiety disorder, unspecified: Secondary | ICD-10-CM

## 2020-12-08 MED ORDER — LISDEXAMFETAMINE DIMESYLATE 40 MG PO CAPS
40.0000 mg | ORAL_CAPSULE | ORAL | 0 refills | Status: DC
Start: 2020-12-08 — End: 2021-05-07

## 2020-12-08 MED ORDER — CETIRIZINE HCL 10 MG PO TABS
10.0000 mg | ORAL_TABLET | Freq: Every day | ORAL | 0 refills | Status: DC
Start: 2020-12-08 — End: 2021-05-06

## 2021-03-17 IMAGING — US US THYROID
1 series · 14 of 25 positions shown · non-contrast
Comparison: None.

CLINICAL DATA: 18-year-old female with a history of enlarged
thyroid

EXAM:
THYROID ULTRASOUND
TECHNIQUE: Ultrasound examination of the thyroid gland and adjacent soft
tissues was performed.

[Series 1: us thyroid · 0.05mm/px · 40 acquisitions, 14 frames shown]
[im 1/40]
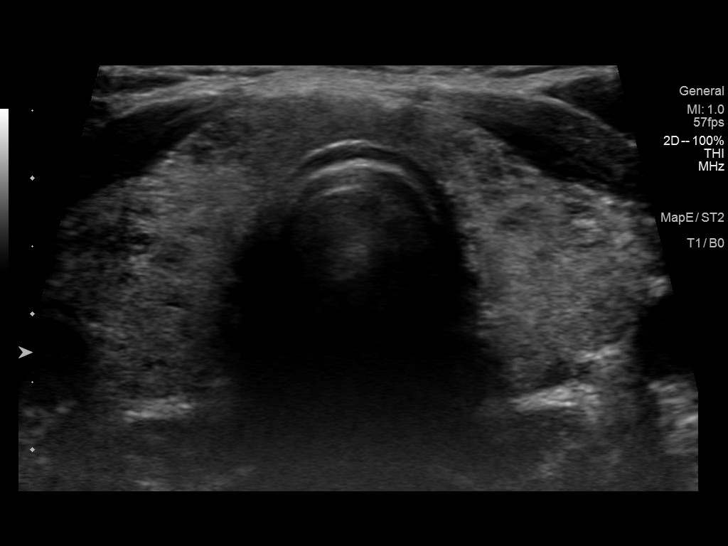
[im 4/40]
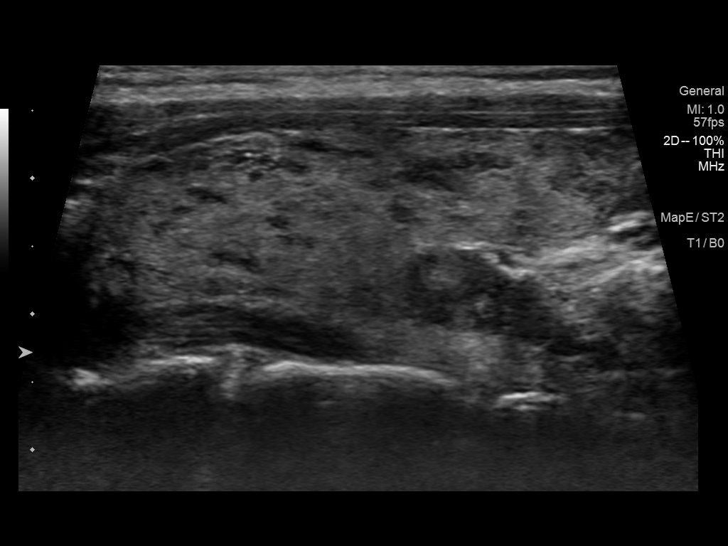
[im 7/40]
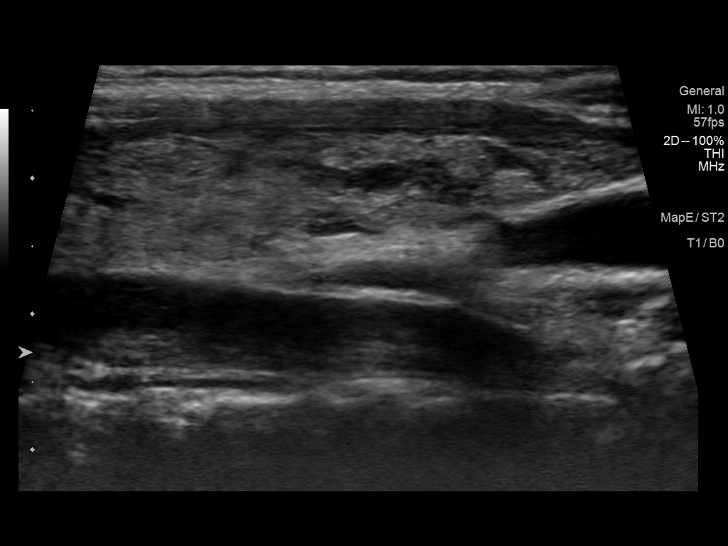
[im 10/40]
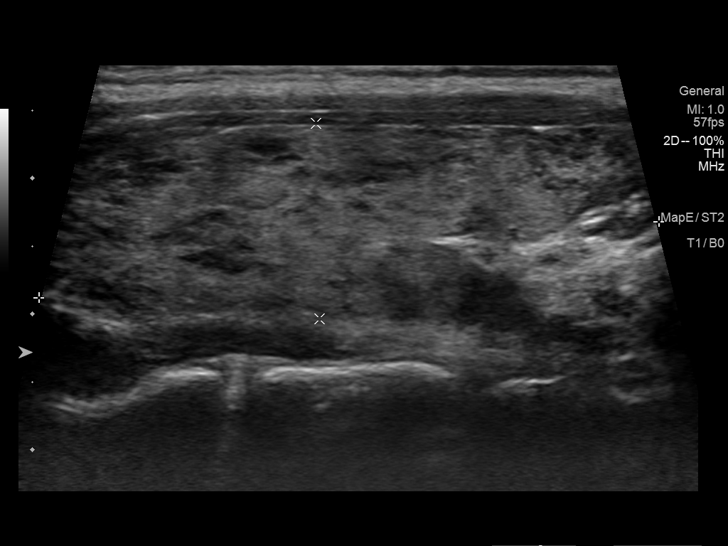
[im 14/40]
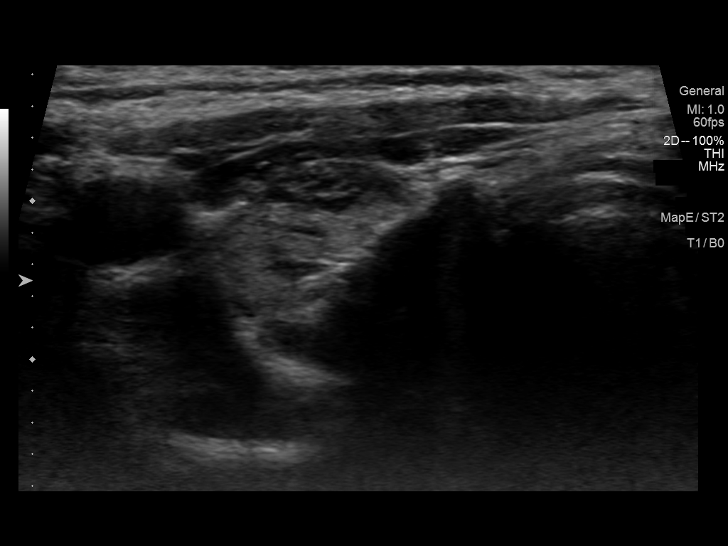
[im 15/40]
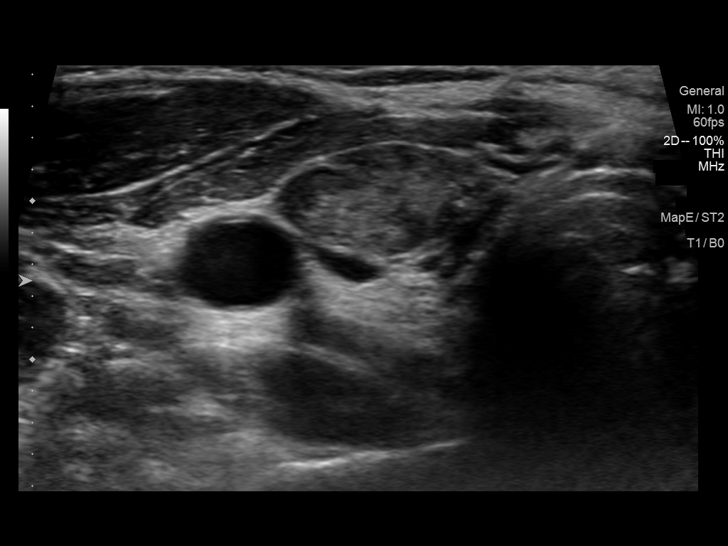
[im 18/40]
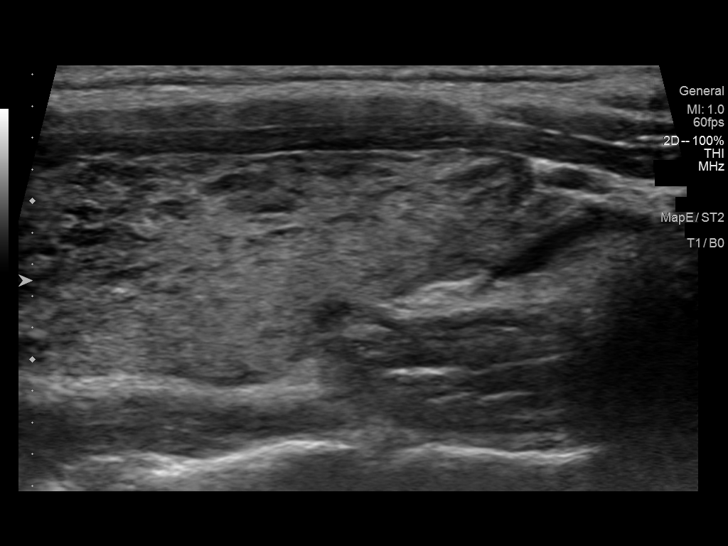
[im 22/40]
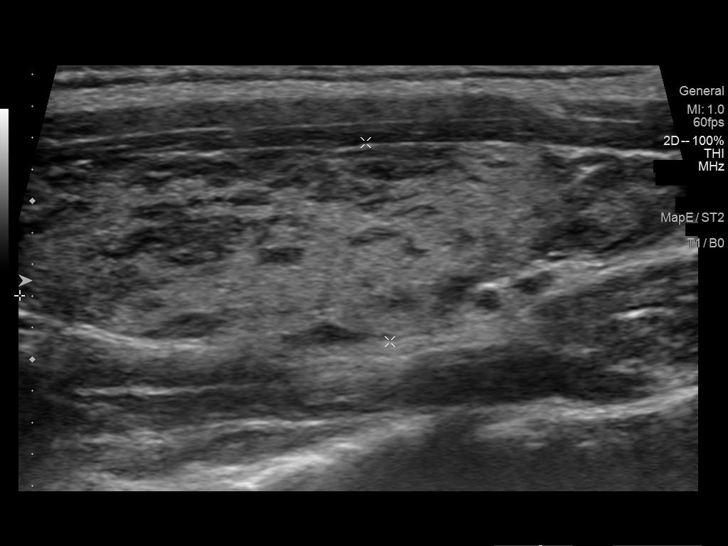
[im 25/40]
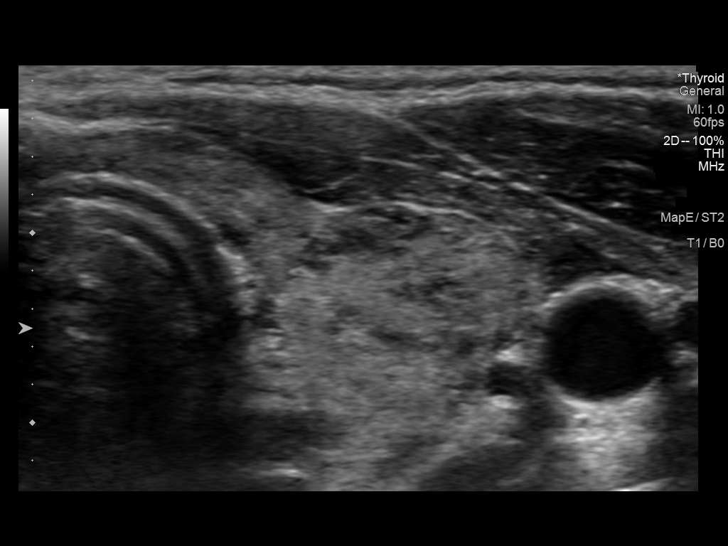
[im 27/40]
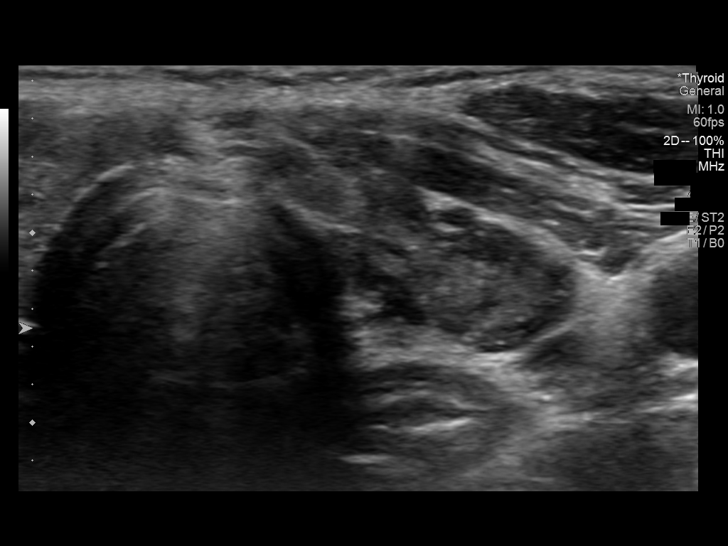
[im 30/40]
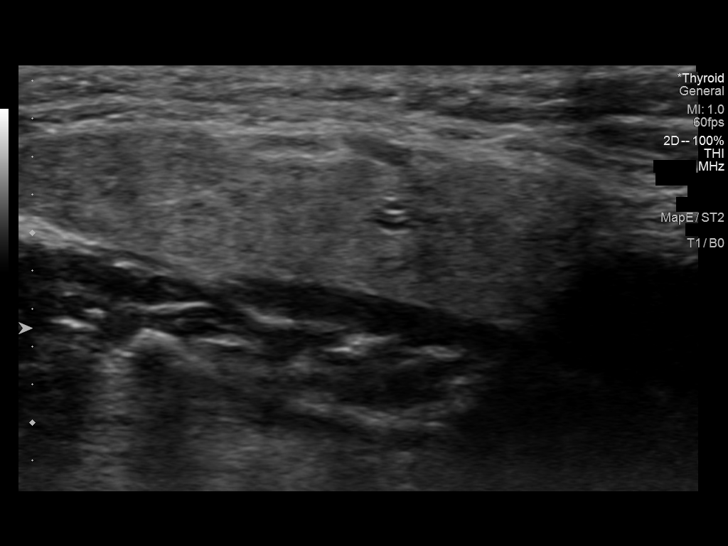
[im 33/40]
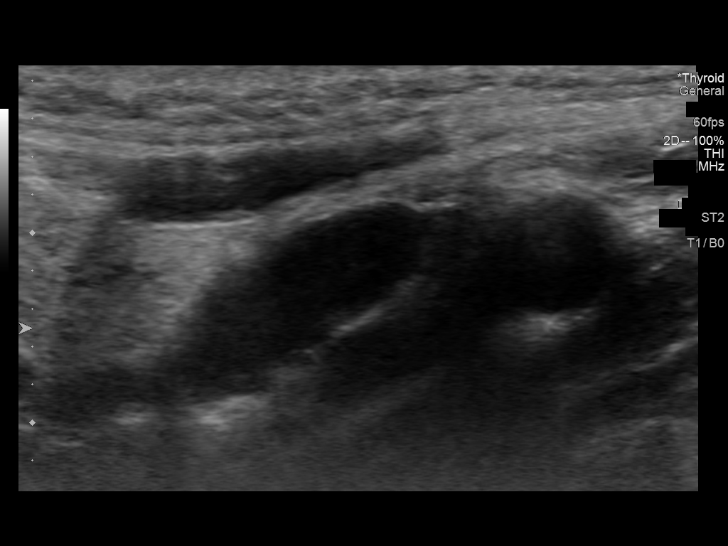
[im 36/40]
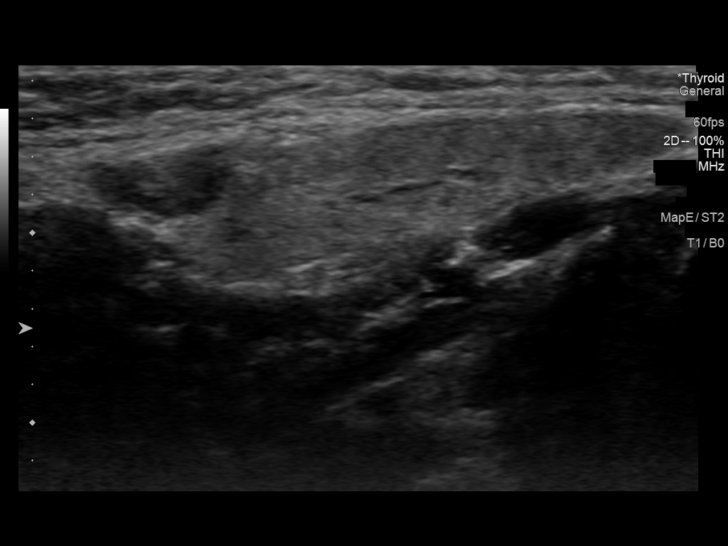
[im 40/40]
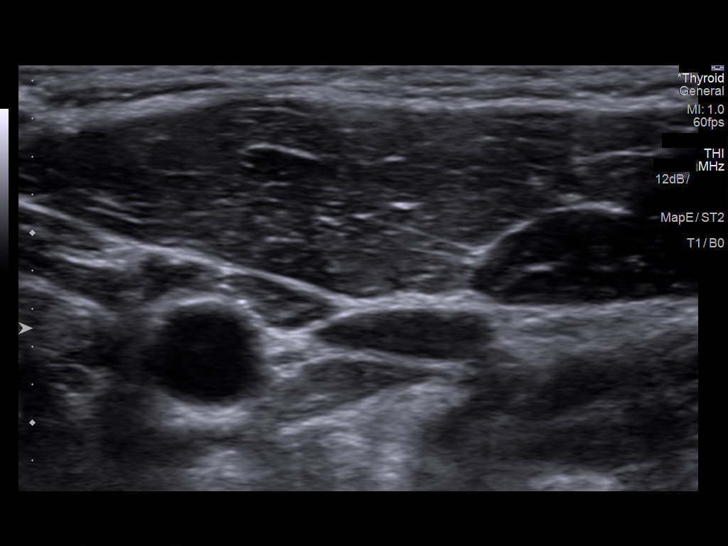

[14 of 25 positions shown; findings below may reference images not displayed]

FINDINGS: Parenchymal Echotexture: Moderately heterogenous

Isthmus: 0.4 cm

Right lobe: 4.5 cm x 1.5 cm x 1.6 cm

Left lobe: 4.7 cm x 1.5 cm x 1.4 cm

_________________________________________________________

Estimated total number of nodules >/= 1 cm: 0

Number of spongiform nodules >/=  2 cm not described below (TR1): 0

Number of mixed cystic and solid nodules >/= 1.5 cm not described
below (TR2): 0

_________________________________________________________

No discrete nodules are seen within the thyroid gland. Diffusely
increased flow within the thyroid tissue.

No adenopathy
IMPRESSION: Heterogeneous thyroid compatible with medical thyroid disease.

## 2021-04-30 ENCOUNTER — Other Ambulatory Visit: Payer: Self-pay | Admitting: Internal Medicine

## 2021-05-03 NOTE — Telephone Encounter (Signed)
Last OV; 09/16/2020 Last Refill: 06/22/2020  Disp: 84    R: 3  Future OV: None scheduled

## 2021-05-06 ENCOUNTER — Other Ambulatory Visit: Payer: Self-pay

## 2021-05-07 ENCOUNTER — Telehealth: Payer: Self-pay | Admitting: Internal Medicine

## 2021-05-07 MED ORDER — CETIRIZINE HCL 10 MG PO TABS: 10.0000 mg | ORAL_TABLET | Freq: Every day | ORAL | 0 refills | Status: AC

## 2021-05-07 MED ORDER — LISDEXAMFETAMINE DIMESYLATE 40 MG PO CAPS
40.0000 mg | ORAL_CAPSULE | ORAL | 0 refills | Status: DC
Start: 2021-05-07 — End: 2021-07-28

## 2021-05-07 NOTE — Telephone Encounter (Signed)
Pt mother call and stated pt need a refill on  lisdexamfetamine (VYVANSE) 40 MG capsule sent to  [facility] [address] [place] - [address] Phone:  [phone]  Fax:  [phone]

## 2021-05-07 NOTE — Addendum Note (Signed)
Addended by: Gershon Crane A on: 05/07/2021 04:55 PM   Modules accepted: Orders

## 2021-05-07 NOTE — Telephone Encounter (Signed)
Done

## 2021-05-07 NOTE — Telephone Encounter (Signed)
Last OV:  09/16/2020 Last refill: 12/08/2020  Disp: 90  R: 0  Future OV: None scheduled

## 2021-05-07 NOTE — Telephone Encounter (Signed)
Dr. Fabian Sharp patient Last refill-12/08/20 90 capsules Last video visit- 09/16/20

## 2021-06-02 ENCOUNTER — Other Ambulatory Visit: Payer: Self-pay

## 2021-06-02 MED ORDER — DROSPIRENONE-ETHINYL ESTRADIOL 3-0.02 MG PO TABS: 1.0000 | ORAL_TABLET | Freq: Every day | ORAL | 3 refills | Status: DC

## 2021-06-02 NOTE — Telephone Encounter (Signed)
Can refill 3 months worth   Have her make appt   this summer in person for further refills.

## 2021-06-03 NOTE — Telephone Encounter (Signed)
Mychart message sent informing the pt to schedule a visit before her next refill.

## 2021-06-27 NOTE — Progress Notes (Deleted)
No chief complaint on file.   HPI: Patient  Shelly Fox  20 y.o. comes in today for Preventive Health Care visit  medication management  Health Maintenance  Topic Date Due   Pneumococcal Vaccine 59-15 Years old (1 - PCV) Never done   HIV Screening  Never done   Hepatitis C Screening  Never done   TETANUS/TDAP  Never done   INFLUENZA VACCINE  06/28/2021   HPV VACCINES  Completed   Health Maintenance Review LIFESTYLE:  Exercise:   Tobacco/ETS: Alcohol:  Sugar beverages: Sleep: Drug use: no HH of  Work:    ROS:  REST of 12 system review negative except as per HPI   Past Medical History:  Diagnosis Date   Attention and concentration deficit    eval for add poss ocd and hyperfocus failed med trial in elem school   Seasonal allergies    rhinitis on meds     Past Surgical History:  Procedure Laterality Date   NO PAST SURGERIES      Family History  Problem Relation Age of Onset   Hyperlipidemia Father    Asthma Father    Allergies Father    Allergies Mother    Alcohol abuse Maternal Grandfather     Social History   Socioeconomic History   Marital status: Single    Spouse name: Not on file   Number of children: Not on file   Years of education: Not on file   Highest education level: Not on file  Occupational History   Occupation: Student  Tobacco Use   Smoking status: Never   Smokeless tobacco: Never  Vaping Use   Vaping Use: Former  Substance and Sexual Activity   Alcohol use: Yes    Alcohol/week: 0.0 standard drinks    Comment: Occassional   Drug use: Never   Sexual activity: Not Currently  Other Topics Concern   Not on file  Social History Narrative   [employer] to go to [employer]	[place]   Parents divorced 2 HH   Parents james Mcmullen mzsers Airline pilot rep aed and [employer] cabin crew college realestate   stepdad Clifton Custard Zseltvay  Step mom Brigitte Soderberg( a legal guardian reported)   Week trnasfers mom hh has 2 dogs net ets FA    Older sis   Social  Determinants of Health   Financial Resource Strain: Not on file  Food Insecurity: Not on file  Transportation Needs: Not on file  Physical Activity: Not on file  Stress: Not on file  Social Connections: Not on file    Outpatient Medications Prior to Visit  Medication Sig Dispense Refill   cetirizine (ZYRTEC) 10 MG tablet Take 1 tablet (10 mg total) by mouth daily. 90 tablet 0   citalopram (CELEXA) 10 MG tablet TAKE ONE TABLET BY MOUTH DAILY 90 tablet 1   drospirenone-ethinyl estradiol (NIKKI) 3-0.02 MG tablet Take 1 tablet by mouth daily. 30 tablet 3   fluticasone (FLONASE) 50 MCG/ACT nasal spray Place 2 sprays into both nostrils daily. 16 g 2   ibuprofen (ADVIL,MOTRIN) 800 MG tablet as needed.     lisdexamfetamine (VYVANSE) 40 MG capsule Take 1 capsule (40 mg total) by mouth every morning. 90 capsule 0   No facility-administered medications prior to visit.     EXAM:  There were no vitals taken for this visit.  There is no height or weight on file to calculate BMI. Wt Readings from Last 3 Encounters:  07/06/20 145 lb (65.8 kg) (76 %, Z=  0.72)*  06/02/20 148 lb 6.4 oz (67.3 kg) (80 %, Z= 0.84)*  05/29/19 145 lb (65.8 kg) (80 %, Z= 0.83)*   * Growth percentiles are based on CDC (Girls, 2-20 Years) data.    Physical Exam: Vital signs reviewed LZJ:QBHA is a well-developed well-nourished alert cooperative    who appearsr stated age in no acute distress.  HEENT: normocephalic atraumatic , Eyes: PERRL EOM's full, conjunctiva clear, Nares: paten,t no deformity discharge or tenderness., Ears: no deformity EAC's clear TMs with normal landmarks. Mouth: clear OP, no lesions, edema.  Moist mucous membranes. Dentition in adequate repair. NECK: supple without masses, thyromegaly or bruits. CHEST/PULM:  Clear to auscultation and percussion breath sounds equal no wheeze , rales or rhonchi.Breast: normal by inspection . No dimpling, discharge, masses, tenderness or discharge . CV: PMI is  nondisplaced, S1 S2 no gallops, murmurs, rubs. Peripheral pulses are full without delay.No JVD .  ABDOMEN: Bowel sounds normal nontender  No guard or rebound, no hepato splenomegal no CVA tenderness.  No hernia. Extremtities:  No clubbing cyanosis or edema, no acute joint swelling or redness no focal atrophy NEURO:  Oriented x3, cranial nerves 3-12 appear to be intact, no obvious focal weakness,gait within normal limits no abnormal reflexes or asymmetrical SKIN: No acute rashes normal turgor, color, no bruising or petechiae. PSYCH: Oriented, good eye contact, no obvious depression anxiety, cognition and judgment appear normal. LN: no cervical axillary inguinal adenopathy  Lab Results  Component Value Date   WBC 7.5 05/29/2019   HGB 12.6 05/29/2019   HCT 37.7 05/29/2019   PLT 246.0 05/29/2019   GLUCOSE 83 05/29/2019   CHOL 159 05/29/2019   TRIG 125.0 05/29/2019   HDL 53.90 05/29/2019   LDLCALC 80 05/29/2019   ALT 15 05/29/2019   AST 29 05/29/2019   NA 138 05/29/2019   K 4.6 05/29/2019   CL 104 05/29/2019   CREATININE 0.95 05/29/2019   BUN 12 05/29/2019   CO2 26 05/29/2019   TSH 2.12 05/29/2019    BP Readings from Last 3 Encounters:  06/02/20 110/68  05/29/19 110/60  10/17/18 (!) 102/64      ASSESSMENT AND PLAN:  Discussed the following assessment and plan:  No diagnosis found. No follow-ups on file.  Patient Care Team: Klani Caridi, Neta Mends, MD as PCP - General (Internal Medicine) There are no Patient Instructions on file for this visit.  Neta Mends. Mckenzie Toruno M.D.

## 2021-06-28 ENCOUNTER — Ambulatory Visit: Payer: Self-pay | Admitting: Internal Medicine

## 2021-07-01 ENCOUNTER — Telehealth (INDEPENDENT_AMBULATORY_CARE_PROVIDER_SITE_OTHER): Payer: Self-pay | Admitting: Internal Medicine

## 2021-07-01 ENCOUNTER — Encounter: Payer: Self-pay | Admitting: Internal Medicine

## 2021-07-01 VITALS — Ht 66.08 in | Wt 145.0 lb

## 2021-07-01 DIAGNOSIS — Z79899 Other long term (current) drug therapy: Secondary | ICD-10-CM

## 2021-07-01 DIAGNOSIS — F901 Attention-deficit hyperactivity disorder, predominantly hyperactive type: Secondary | ICD-10-CM

## 2021-07-01 DIAGNOSIS — U071 COVID-19: Secondary | ICD-10-CM

## 2021-07-01 DIAGNOSIS — F419 Anxiety disorder, unspecified: Secondary | ICD-10-CM

## 2021-07-01 NOTE — Progress Notes (Signed)
Virtual Visit via Video Note  I connected withNAME@ on 07/01/21 at 11:00 AM EDT by a video enabled telemedicine application and verified that I am speaking with the correct person using two identifiers. Location patient: home Location provider:home office Persons participating in the virtual visit: patient, provider  [place] national recommendations  regarding COVID 19 pandemic   video visit is advised over in office visit for this patient.  Patient aware  of the limitations of evaluation and management by telemedicine and  availability of in person appointments. and agreed to proceed.   HPI: Shelly Fox presents for video visit   update eval for medications Anxiety taking citalopram 10 mg a day is very helpful for anxiety however she notes that perhaps there is a mental fogginess at times she has been on this medicine for a year.  She did stop it this week when she developed the COVID infection with sore throat body aches now getting better.  ADD ADHD on Vyvanse 40 mg seems to be helpful takes around 730 in the early in the morning does not affect sleep or mood as far she can tell feels this dose has been helpful.  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  Allergy same allergy medicine seems to be helpful. Begin school 815 has a follow-up in person visit at the end of the month.  Neg td  ocass etoh sleep no change exercises regularly.  Also has had some tenderness intermittently in the right axilla may be a small gland no redness not bad now.  ROS: See pertinent positives and negatives per HPI.  Past Medical History:  Diagnosis Date   Attention and concentration deficit    eval for add poss ocd and hyperfocus failed med trial in elem school   Seasonal allergies    rhinitis on meds     Past Surgical History:  Procedure Laterality Date   NO PAST SURGERIES      Family History  Problem  Relation Age of Onset   Hyperlipidemia Father    Asthma Father    Allergies Father    Allergies Mother    Alcohol abuse Maternal Grandfather     Social History   Tobacco Use   Smoking status: Never   Smokeless tobacco: Never  Vaping Use   Vaping Use: Former  Substance Use Topics   Alcohol use: Yes    Alcohol/week: 0.0 standard drinks    Comment: Occassional   Drug use: Never      Current Outpatient Medications:    cetirizine (ZYRTEC) 10 MG tablet, Take 1 tablet (10 mg total) by mouth daily., Disp: 90 tablet, Rfl: 0   citalopram (CELEXA) 10 MG tablet, TAKE ONE TABLET BY MOUTH DAILY, Disp: 90 tablet, Rfl: 1   drospirenone-ethinyl estradiol (NIKKI) 3-0.02 MG tablet, Take 1 tablet by mouth daily., Disp: 30 tablet, Rfl: 3   ibuprofen (ADVIL,MOTRIN) 800 MG tablet, as needed., Disp: , Rfl:    lisdexamfetamine (VYVANSE) 40 MG capsule, Take 1 capsule (40 mg total) by mouth every morning., Disp: 90 capsule, Rfl: 0   fluticasone (FLONASE) 50 MCG/ACT nasal spray, Place 2 sprays into both nostrils daily., Disp: 16 g, Rfl: 2  EXAM: BP Readings from Last 3 Encounters:  06/02/20 110/68  05/29/19 110/60  10/17/18 (!) 102/64    VITALS per patient if applicable:  GENERAL: alert, oriented, appears well and in no acute distress  HEENT: atraumatic, conjunttiva clear, no obvious abnormalities on inspection of external nose and ears  NECK: normal movements of the head and neck  LUNGS: on inspection no signs of respiratory distress, breathing rate appears normal, no obvious gross SOB, gasping or wheezing  CV: no obvious cyanosis  MS: moves all visible extremities without noticeable abnormality  PSYCH/NEURO: pleasant and cooperative, no obvious depression or anxiety, speech and thought processing grossly intact Lab Results  Component Value Date   WBC 7.5 05/29/2019   HGB 12.6 05/29/2019   HCT 37.7 05/29/2019   PLT 246.0 05/29/2019   GLUCOSE 83 05/29/2019   CHOL 159 05/29/2019    TRIG 125.0 05/29/2019   HDL 53.90 05/29/2019   LDLCALC 80 05/29/2019   ALT 15 05/29/2019   AST 29 05/29/2019   NA 138 05/29/2019   K 4.6 05/29/2019   CL 104 05/29/2019   CREATININE 0.95 05/29/2019   BUN 12 05/29/2019   CO2 26 05/29/2019   TSH 2.12 05/29/2019    ASSESSMENT AND PLAN:  Discussed the following assessment and plan:    ICD-10-CM   1. Medication management  Z79.899     2. Anxiety disorder, unspecified type  F41.9     3. ADHD (attention deficit hyperactivity disorder), predominantly hyperactive impulsive type  F90.1     4. COVID-19 virus infection  U07.1      At this point same medication caution with stopping citalopram acutely although it is only 10 mg If there is a concern or it we could consider switching to something like Lexapro but would rather have your reeval in person when she comes anything to the month. Continue Vyvanse 40 Check axillary area when comes in person  doesn't sound alarming at this time Counseled.   Expectant management and discussion of plan and treatment with opportunity to ask questions and all were answered. The patient agreed with the plan and demonstrated an understanding of the instructions.   Advised to call back or seek an in-person evaluation if worsening  or having  further concerns . Return for when planned.   Berniece Andreas, MD

## 2021-07-27 NOTE — Progress Notes (Signed)
Chief Complaint  Patient presents with   Annual Exam    HPI: Patient  Shelly Fox  20 y.o. comes in today for Preventive Health Care visit  and med check  Vision:  Contacts  changed   doing well.otherwise .   Vyvanse helpful  would liketo continue on the 40 mg  no sig se noted   Also the citalopram 10 mg seems controlling anxiety some days may think should increase dose but may be adjustment to school  17 hours credits    plan  cna work for  hours. ( Plan is  to go to [place] school)   Check  right breast area  seems no notice before period and gon in a few days no redness Health Maintenance  Topic Date Due   Pneumococcal Vaccine 42-81 Years old (1 - PCV) Never done   HIV Screening  Never done   Hepatitis C Screening  Never done   COVID-19 Vaccine (3 - Booster for Janssen series) 10/16/2020   INFLUENZA VACCINE  06/28/2021   TETANUS/TDAP  06/14/2022   HPV VACCINES  Completed   Health Maintenance Review LIFESTYLE:  Exerciseok   Tobacco/ETS:n Alcohol: n Sugar beverages:n Sleep:7at worsemore  Drug use: no     ROS:  periods  3-4 days  no problems no SA no need for sti , gets hot easily has seen GMA for thyroid enlargment in past  REST of 12 system review negative except as per HPI   Past Medical History:  Diagnosis Date   Attention and concentration deficit    eval for add poss ocd and hyperfocus failed med trial in elem school   Seasonal allergies    rhinitis on meds     Past Surgical History:  Procedure Laterality Date   NO PAST SURGERIES      Family History  Problem Relation Age of Onset   Hyperlipidemia Father    Asthma Father    Allergies Father    Allergies Mother    Alcohol abuse Maternal Grandfather     Social History   Socioeconomic History   Marital status: Single    Spouse name: Not on file   Number of children: Not on file   Years of education: Not on file   Highest education level: Not on file  Occupational History   Occupation: Student   Tobacco Use   Smoking status: Never   Smokeless tobacco: Never  Vaping Use   Vaping Use: Former  Substance and Sexual Activity   Alcohol use: Yes    Alcohol/week: 0.0 standard drinks    Comment: Occassional   Drug use: Never   Sexual activity: Not Currently  Other Topics Concern   Not on file  Social History Narrative   [employer] to go to [employer]	[place]   Parents divorced 2 HH   Parents james [employer] pilot rep aed and [employer] cabin crew college realestate   stepdad Clifton Custard Zseltvay  Step mom Latunya Kissick( a legal guardian reported)   Week trnasfers mom hh has 2 dogs net ets FA    Older sis   Social Determinants of Health   Financial Resource Strain: Not on file  Food Insecurity: Not on file  Transportation Needs: Not on file  Physical Activity: Not on file  Stress: Not on file  Social Connections: Not on file    Outpatient Medications Prior to Visit  Medication Sig Dispense Refill   cetirizine (ZYRTEC) 10 MG tablet Take 1 tablet (10 mg total) by  mouth daily. 90 tablet 0   drospirenone-ethinyl estradiol (NIKKI) 3-0.02 MG tablet Take 1 tablet by mouth daily. 30 tablet 3   ibuprofen (ADVIL,MOTRIN) 800 MG tablet as needed.     citalopram (CELEXA) 10 MG tablet TAKE ONE TABLET BY MOUTH DAILY 90 tablet 1   lisdexamfetamine (VYVANSE) 40 MG capsule Take 1 capsule (40 mg total) by mouth every morning. 90 capsule 0   fluticasone (FLONASE) 50 MCG/ACT nasal spray Place 2 sprays into both nostrils daily. 16 g 2   No facility-administered medications prior to visit.     EXAM:  BP 110/60 (BP Location: Left Arm, Patient Position: Sitting, Cuff Size: Normal)   Pulse 76   Temp 98.7 F (37.1 C) (Oral)   Ht 5\' 7"  (1.702 m)   Wt 148 lb 6.4 oz (67.3 kg)   LMP 07/28/2021   SpO2 98%   BMI 23.24 kg/m   Body mass index is 23.24 kg/m. Wt Readings from Last 3 Encounters:  07/28/21 148 lb 6.4 oz (67.3 kg)  07/01/21 145 lb (65.8 kg)  07/06/20 145 lb (65.8 kg) (76 %, Z= 0.72)*   *  Growth percentiles are based on CDC (Girls, 2-20 Years) data.    Physical Exam: Vital signs reviewed 09/05/20 is a well-developed well-nourished alert cooperative    who appearsr stated age in no acute distress.  HEENT: normocephalic atraumatic , Eyes: PERRL EOM's full, conjunctiva clear, Nares: paten,t no deformity discharge or tenderness., Ears: no deformity EAC's clear TMs with normal landmarks. Mouth:masked  NECK: supple without masses, or bruits. Thyroid palpable CHEST/PULM:  Clear to auscultation and percussion breath sounds equal no wheeze , rales or rhonchi. No chest wall deformities or tenderness. Ruo q breast tissue  no nodule  soft mobile  Breast: normal by inspection . No dimpling, discharge, masses, tenderness or discharge . CV: PMI is nondisplaced, S1 S2 no gallops, murmurs, rubs. Peripheral pulses are full without delay.No JVD .  ABDOMEN: Bowel sounds normal nontender  No guard or rebound, no hepato splenomegal no CVA tenderness.   Extremtities:  No clubbing cyanosis or edema, no acute joint swelling or redness no focal atrophy NEURO:  Oriented x3, cranial nerves 3-12 appear to be intact, no obvious focal weakness,gait within normal limits no abnormal reflexes or asymmetrical SKIN: No acute rashes normal turgor, color, no bruising or petechiae. Fine faded acne forehead  PSYCH: Oriented, good eye contact, no obvious depression anxiety, cognition and judgment appear normal. LN: no cervical axillary inguinal adenopathy  Lab Results  Component Value Date   WBC 7.5 05/29/2019   HGB 12.6 05/29/2019   HCT 37.7 05/29/2019   PLT 246.0 05/29/2019   GLUCOSE 83 05/29/2019   CHOL 159 05/29/2019   TRIG 125.0 05/29/2019   HDL 53.90 05/29/2019   LDLCALC 80 05/29/2019   ALT 15 05/29/2019   AST 29 05/29/2019   NA 138 05/29/2019   K 4.6 05/29/2019   CL 104 05/29/2019   CREATININE 0.95 05/29/2019   BUN 12 05/29/2019   CO2 26 05/29/2019   TSH 2.12 05/29/2019    BP Readings from Last  3 Encounters:  07/28/21 110/60  06/02/20 110/68  05/29/19 110/60    Lab disc   none needed .  But  fu with endocrine about thyroid    ASSESSMENT AND PLAN:  Discussed the following assessment and plan:    ICD-10-CM   1. Visit for preventive health examination  Z00.00     2. Attention and concentration deficit  R41.840  3. ADHD (attention deficit hyperactivity disorder), predominantly hyperactive impulsive type  F90.1     4. Anxiety disorder, unspecified type  F41.9     5. Oral contraceptive pill surveillance  Z30.41     6. Medication management  Z79.899     Continue   same med dose  Fu if breast sx   persistent or progressive  Have pharmacy let us know  about refills  6 mos med check  Return in about 6 months (around 01/25/2022).  Patient Care Team: Mayerli Kirst, Neta Mends, MD as PCP - General (Internal Medicine) Patient Instructions  Good to see you today. Et back with endocrinology about thyroid surveillance.  Monitor the  lump area   and if  persistent or progressive  make a follow up exam.   This could be  homone   stimulated  breast tissue and cycles up and down .   Same medication  dosing. Manage sleep .    Rov 6 mos med check or earlier  if needed   Health Maintenance, Female Adopting a healthy lifestyle and getting preventive care are important in promoting health and wellness. Ask your health care provider about: The right schedule for you to have regular tests and exams. Things you can do on your own to prevent diseases and keep yourself healthy. What should I know about diet, weight, and exercise? Eat a healthy diet  Eat a diet that includes plenty of vegetables, fruits, low-fat dairy products, and lean protein. Do not eat a lot of foods that are high in solid fats, added sugars, or sodium. Maintain a healthy weight Body mass index (BMI) is used to identify weight problems. It estimates body fat based on height and weight. Your health care provider can  help determine your BMI and help you achieve or maintain a healthy weight. Get regular exercise Get regular exercise. This is one of the most important things you can do for your health. Most adults should: Exercise for at least 150 minutes each week. The exercise should increase your heart rate and make you sweat (moderate-intensity exercise). Do strengthening exercises at least twice a week. This is in addition to the moderate-intensity exercise. Spend less time sitting. Even light physical activity can be beneficial. Watch cholesterol and blood lipids Have your blood tested for lipids and cholesterol at 20 years of age, then have this test every 5 years. Have your cholesterol levels checked more often if: Your lipid or cholesterol levels are high. You are older than 20 years of age. You are at high risk for heart disease. What should I know about cancer screening? Depending on your health history and family history, you may need to have cancer screening at various ages. This may include screening for: Breast cancer. Cervical cancer. Colorectal cancer. Skin cancer. Lung cancer. What should I know about heart disease, diabetes, and high blood pressure? Blood pressure and heart disease High blood pressure causes heart disease and increases the risk of stroke. This is more likely to develop in people who have high blood pressure readings, are of African descent, or are overweight. Have your blood pressure checked: Every 3-5 years if you are 55-35 years of age. Every year if you are 13 years old or older. Diabetes Have regular diabetes screenings. This checks your fasting blood sugar level. Have the screening done: Once every three years after age 8 if you are at a normal weight and have a low risk for diabetes. More often and at a younger age  if you are overweight or have a high risk for diabetes. What should I know about preventing infection? Hepatitis B If you have a higher risk for  hepatitis B, you should be screened for this virus. Talk with your health care provider to find out if you are at risk for hepatitis B infection. Hepatitis C Testing is recommended for: Everyone born from [birth year] through 1965. Anyone with known risk factors for hepatitis C. Sexually transmitted infections (STIs) Get screened for STIs, including gonorrhea and chlamydia, if: You are sexually active and are younger than 20 years of age. You are older than 20 years of age and your health care provider tells you that you are at risk for this type of infection. Your sexual activity has changed since you were last screened, and you are at increased risk for chlamydia or gonorrhea. Ask your health care provider if you are at risk. Ask your health care provider about whether you are at high risk for HIV. Your health care provider may recommend a prescription medicine to help prevent HIV infection. If you choose to take medicine to prevent HIV, you should first get tested for HIV. You should then be tested every 3 months for as long as you are taking the medicine. Pregnancy If you are about to stop having your period (premenopausal) and you may become pregnant, seek counseling before you get pregnant. Take 400 to 800 micrograms (mcg) of folic acid every day if you become pregnant. Ask for birth control (contraception) if you want to prevent pregnancy. Osteoporosis and menopause Osteoporosis is a disease in which the bones lose minerals and strength with aging. This can result in bone fractures. If you are 20 years old or older, or if you are at risk for osteoporosis and fractures, ask your health care provider if you should: Be screened for bone loss. Take a calcium or vitamin D supplement to lower your risk of fractures. Be given hormone replacement therapy (HRT) to treat symptoms of menopause. Follow these instructions at home: Lifestyle Do not use any products that contain nicotine or tobacco, such as  cigarettes, e-cigarettes, and chewing tobacco. If you need help quitting, ask your health care provider. Do not use street drugs. Do not share needles. Ask your health care provider for help if you need support or information about quitting drugs. Alcohol use Do not drink alcohol if: Your health care provider tells you not to drink. You are pregnant, may be pregnant, or are planning to become pregnant. If you drink alcohol: Limit how much you use to 0-1 drink a day. Limit intake if you are breastfeeding. Be aware of how much alcohol is in your drink. In the [place] one drink equals one 12 oz bottle of beer (355 mL), one 5 oz glass of wine (148 mL), or one 1 oz glass of hard liquor (44 mL). General instructions Schedule regular health, dental, and eye exams. Stay current with your vaccines. Tell your health care provider if: You often feel depressed. You have ever been abused or do not feel safe at home. Summary Adopting a healthy lifestyle and getting preventive care are important in promoting health and wellness. Follow your health care provider's instructions about healthy diet, exercising, and getting tested or screened for diseases. Follow your health care provider's instructions on monitoring your cholesterol and blood pressure. This information is not intended to replace advice given to you by your health care provider. Make sure you discuss any questions you have with your health  care provider. Document Revised: 01/22/2021 Document Reviewed: 11/07/2018 Elsevier Patient Education  2022 [employer].  [place]. Perrin Eddleman M.D.

## 2021-07-28 ENCOUNTER — Other Ambulatory Visit: Payer: Self-pay

## 2021-07-28 ENCOUNTER — Encounter: Payer: Self-pay | Admitting: Internal Medicine

## 2021-07-28 ENCOUNTER — Ambulatory Visit (INDEPENDENT_AMBULATORY_CARE_PROVIDER_SITE_OTHER): Payer: Self-pay | Admitting: Internal Medicine

## 2021-07-28 VITALS — BP 110/60 | HR 76 | Temp 98.7°F | Ht 67.0 in | Wt 148.4 lb

## 2021-07-28 DIAGNOSIS — F901 Attention-deficit hyperactivity disorder, predominantly hyperactive type: Secondary | ICD-10-CM

## 2021-07-28 DIAGNOSIS — Z0000 Encounter for general adult medical examination without abnormal findings: Secondary | ICD-10-CM

## 2021-07-28 DIAGNOSIS — F419 Anxiety disorder, unspecified: Secondary | ICD-10-CM

## 2021-07-28 DIAGNOSIS — Z79899 Other long term (current) drug therapy: Secondary | ICD-10-CM

## 2021-07-28 DIAGNOSIS — R41840 Attention and concentration deficit: Secondary | ICD-10-CM

## 2021-07-28 DIAGNOSIS — Z3041 Encounter for surveillance of contraceptive pills: Secondary | ICD-10-CM

## 2021-07-28 MED ORDER — LISDEXAMFETAMINE DIMESYLATE 40 MG PO CAPS: 40.0000 mg | ORAL_CAPSULE | ORAL | 0 refills | Status: DC

## 2021-07-28 MED ORDER — CITALOPRAM HYDROBROMIDE 10 MG PO TABS: 10.0000 mg | ORAL_TABLET | Freq: Every day | ORAL | 1 refills | Status: DC

## 2021-07-28 NOTE — Patient Instructions (Signed)
Good to see you today. Et back with endocrinology about thyroid surveillance.  Monitor the  lump area   and if  persistent or progressive  make a follow up exam.   This could be  homone   stimulated  breast tissue and cycles up and down .   Same medication  dosing. Manage sleep .    Rov 6 mos med check or earlier  if needed   Health Maintenance, Female Adopting a healthy lifestyle and getting preventive care are important in promoting health and wellness. Ask your health care provider about: The right schedule for you to have regular tests and exams. Things you can do on your own to prevent diseases and keep yourself healthy. What should I know about diet, weight, and exercise? Eat a healthy diet  Eat a diet that includes plenty of vegetables, fruits, low-fat dairy products, and lean protein. Do not eat a lot of foods that are high in solid fats, added sugars, or sodium. Maintain a healthy weight Body mass index (BMI) is used to identify weight problems. It estimates body fat based on height and weight. Your health care provider can help determine your BMI and help you achieve or maintain a healthy weight. Get regular exercise Get regular exercise. This is one of the most important things you can do for your health. Most adults should: Exercise for at least 150 minutes each week. The exercise should increase your heart rate and make you sweat (moderate-intensity exercise). Do strengthening exercises at least twice a week. This is in addition to the moderate-intensity exercise. Spend less time sitting. Even light physical activity can be beneficial. Watch cholesterol and blood lipids Have your blood tested for lipids and cholesterol at 20 years of age, then have this test every 5 years. Have your cholesterol levels checked more often if: Your lipid or cholesterol levels are high. You are older than 21 years of age. You are at high risk for heart disease. What should I know about  cancer screening? Depending on your health history and family history, you may need to have cancer screening at various ages. This may include screening for: Breast cancer. Cervical cancer. Colorectal cancer. Skin cancer. Lung cancer. What should I know about heart disease, diabetes, and high blood pressure? Blood pressure and heart disease High blood pressure causes heart disease and increases the risk of stroke. This is more likely to develop in people who have high blood pressure readings, are of African descent, or are overweight. Have your blood pressure checked: Every 3-5 years if you are 29-68 years of age. Every year if you are 71 years old or older. Diabetes Have regular diabetes screenings. This checks your fasting blood sugar level. Have the screening done: Once every three years after age 28 if you are at a normal weight and have a low risk for diabetes. More often and at a younger age if you are overweight or have a high risk for diabetes. What should I know about preventing infection? Hepatitis B If you have a higher risk for hepatitis B, you should be screened for this virus. Talk with your health care provider to find out if you are at risk for hepatitis B infection. Hepatitis C Testing is recommended for: Everyone born from [birth year] through 1965. Anyone with known risk factors for hepatitis C. Sexually transmitted infections (STIs) Get screened for STIs, including gonorrhea and chlamydia, if: You are sexually active and are younger than 20 years of age. You are older  than 20 years of age and your health care provider tells you that you are at risk for this type of infection. Your sexual activity has changed since you were last screened, and you are at increased risk for chlamydia or gonorrhea. Ask your health care provider if you are at risk. Ask your health care provider about whether you are at high risk for HIV. Your health care provider may recommend a prescription  medicine to help prevent HIV infection. If you choose to take medicine to prevent HIV, you should first get tested for HIV. You should then be tested every 3 months for as long as you are taking the medicine. Pregnancy If you are about to stop having your period (premenopausal) and you may become pregnant, seek counseling before you get pregnant. Take 400 to 800 micrograms (mcg) of folic acid every day if you become pregnant. Ask for birth control (contraception) if you want to prevent pregnancy. Osteoporosis and menopause Osteoporosis is a disease in which the bones lose minerals and strength with aging. This can result in bone fractures. If you are 39 years old or older, or if you are at risk for osteoporosis and fractures, ask your health care provider if you should: Be screened for bone loss. Take a calcium or vitamin D supplement to lower your risk of fractures. Be given hormone replacement therapy (HRT) to treat symptoms of menopause. Follow these instructions at home: Lifestyle Do not use any products that contain nicotine or tobacco, such as cigarettes, e-cigarettes, and chewing tobacco. If you need help quitting, ask your health care provider. Do not use street drugs. Do not share needles. Ask your health care provider for help if you need support or information about quitting drugs. Alcohol use Do not drink alcohol if: Your health care provider tells you not to drink. You are pregnant, may be pregnant, or are planning to become pregnant. If you drink alcohol: Limit how much you use to 0-1 drink a day. Limit intake if you are breastfeeding. Be aware of how much alcohol is in your drink. In the [place] one drink equals one 12 oz bottle of beer (355 mL), one 5 oz glass of wine (148 mL), or one 1 oz glass of hard liquor (44 mL). General instructions Schedule regular health, dental, and eye exams. Stay current with your vaccines. Tell your health care provider if: You often feel  depressed. You have ever been abused or do not feel safe at home. Summary Adopting a healthy lifestyle and getting preventive care are important in promoting health and wellness. Follow your health care provider's instructions about healthy diet, exercising, and getting tested or screened for diseases. Follow your health care provider's instructions on monitoring your cholesterol and blood pressure. This information is not intended to replace advice given to you by your health care provider. Make sure you discuss any questions you have with your health care provider. Document Revised: 01/22/2021 Document Reviewed: 11/07/2018 Elsevier Patient Education  2022 [employer].

## 2021-09-04 ENCOUNTER — Other Ambulatory Visit: Payer: Self-pay

## 2021-09-06 ENCOUNTER — Other Ambulatory Visit: Payer: Self-pay

## 2021-09-06 MED ORDER — FLUTICASONE PROPIONATE 50 MCG/ACT NA SUSP
2.0000 | Freq: Every day | NASAL | 2 refills | Status: DC
  Filled 2021-09-06: qty 16, 25d supply, fill #0
  Filled 2022-06-06: qty 16, fill #0
  Filled 2022-06-21: qty 16, 30d supply, fill #0

## 2021-09-07 ENCOUNTER — Telehealth: Payer: Self-pay | Admitting: Family Medicine

## 2021-09-07 ENCOUNTER — Other Ambulatory Visit: Payer: Self-pay

## 2021-09-07 MED ORDER — LISDEXAMFETAMINE DIMESYLATE 40 MG PO CAPS
40.0000 mg | ORAL_CAPSULE | ORAL | 0 refills | Status: DC
  Filled 2021-09-07: qty 30, 30d supply, fill #0

## 2021-09-07 NOTE — Telephone Encounter (Signed)
Please find out what pharmacy her Vyvanse refill should be sent to

## 2021-09-07 NOTE — Telephone Encounter (Signed)
Left detailed message for pt to call the office and update the pharmacy so Dr Clent Ridges can send refills for her Vyvanse

## 2021-09-09 ENCOUNTER — Other Ambulatory Visit: Payer: Self-pay

## 2021-09-13 ENCOUNTER — Telehealth: Payer: Self-pay

## 2021-09-13 ENCOUNTER — Other Ambulatory Visit: Payer: Self-pay

## 2021-09-13 NOTE — Telephone Encounter (Signed)
Spoke with Pt step mother states to send Rx for Vyvanse to [facility]

## 2021-09-13 NOTE — Telephone Encounter (Signed)
Patient's step mother called because prescription is not available at pharmacy in [place]. Patient wanted refill sent last week. They are unsure if prescription was sent or not.      Good callback number for step mother is [phone]    Please advise

## 2021-09-13 NOTE — Telephone Encounter (Signed)
Message sent to Dr Fabian Sharp for advise

## 2021-09-14 ENCOUNTER — Other Ambulatory Visit: Payer: Self-pay

## 2021-09-14 MED ORDER — LISDEXAMFETAMINE DIMESYLATE 40 MG PO CAPS: 40.0000 mg | ORAL_CAPSULE | ORAL | 0 refills | Status: DC

## 2021-09-14 NOTE — Telephone Encounter (Signed)
Sent in electronically .  

## 2021-09-14 NOTE — Addendum Note (Signed)
Addended byMadelin Headings on: 09/14/2021 07:24 PM   Modules accepted: Orders

## 2021-09-15 NOTE — Telephone Encounter (Addendum)
Pt dad Fayrene Fearing is calling and would like to have additional #60  vyvanse 40 mg sent to  . Pt dad will go pick up #30 [facility] [id number] [place], [place] - [address] Phone:  [phone]  Fax:  [phone]

## 2021-09-19 MED ORDER — LISDEXAMFETAMINE DIMESYLATE 40 MG PO CAPS: 40.0000 mg | ORAL_CAPSULE | ORAL | 0 refills | Status: DC

## 2021-09-19 NOTE — Addendum Note (Signed)
Addended byBerniece Andreas K on: 09/19/2021 07:50 PM   Modules accepted: Orders

## 2021-09-19 NOTE — Telephone Encounter (Signed)
#   60 sent in  to   [facility]  make total  90 days .

## 2021-09-20 NOTE — Telephone Encounter (Signed)
Noted  

## 2021-09-21 ENCOUNTER — Other Ambulatory Visit: Payer: Self-pay | Admitting: Internal Medicine

## 2021-10-11 ENCOUNTER — Telehealth: Payer: Self-pay | Admitting: Internal Medicine

## 2021-10-11 NOTE — Telephone Encounter (Signed)
Pt mother call and stated she need a refill on lisdexamfetamine (VYVANSE) 40 MG capsule and want a 3 mo. suply sent to  [facility] [id number] [place], [place] - [address] Phone:  [phone]  Fax:  [phone]

## 2021-10-12 MED ORDER — LISDEXAMFETAMINE DIMESYLATE 40 MG PO CAPS: 40.0000 mg | ORAL_CAPSULE | ORAL | 0 refills | Status: DC

## 2021-10-12 NOTE — Telephone Encounter (Signed)
Vyvanse 40 dispense 90 sent into the [facility] friendly. In the future patient herself needs to request the refill and not the family   she is  no longer a minor .    Also make sure mom is on DPR.

## 2021-10-13 NOTE — Telephone Encounter (Signed)
Noted  

## 2021-12-31 ENCOUNTER — Other Ambulatory Visit: Payer: Self-pay | Admitting: Internal Medicine

## 2021-12-31 MED ORDER — DROSPIRENONE-ETHINYL ESTRADIOL 3-0.02 MG PO TABS: 1.0000 | ORAL_TABLET | Freq: Every day | ORAL | 1 refills | Status: DC

## 2021-12-31 MED ORDER — LISDEXAMFETAMINE DIMESYLATE 40 MG PO CAPS: 40.0000 mg | ORAL_CAPSULE | ORAL | 0 refills | Status: DC

## 2021-12-31 NOTE — Telephone Encounter (Signed)
Please make her a visit  I will send in the refill  today

## 2021-12-31 NOTE — Telephone Encounter (Signed)
Patient mother called to get refill on lisdexamfetamine (VYVANSE) 40 MG capsule in a 90 day supply and also to see if her refill for NIKKI 3-0.02 MG tablet could be changed to a 90 day supply as well since she is in [place] for college.      Please send to [facility] [id number] [place], [place] - [phone] Phone:  [phone]  Fax:  [phone]         Please advise

## 2022-01-19 ENCOUNTER — Telehealth (INDEPENDENT_AMBULATORY_CARE_PROVIDER_SITE_OTHER): Payer: Self-pay | Admitting: Internal Medicine

## 2022-01-19 ENCOUNTER — Encounter: Payer: Self-pay | Admitting: Internal Medicine

## 2022-01-19 DIAGNOSIS — Z3041 Encounter for surveillance of contraceptive pills: Secondary | ICD-10-CM

## 2022-01-19 DIAGNOSIS — F901 Attention-deficit hyperactivity disorder, predominantly hyperactive type: Secondary | ICD-10-CM

## 2022-01-19 DIAGNOSIS — Z79899 Other long term (current) drug therapy: Secondary | ICD-10-CM

## 2022-01-19 DIAGNOSIS — F419 Anxiety disorder, unspecified: Secondary | ICD-10-CM

## 2022-01-19 NOTE — Progress Notes (Signed)
Virtual Visit via Video Note  I connected with Shelly R Swingler on 01/19/22 at 12:30 PM EST by a video enabled telemedicine application and verified that I am speaking with the correct person using two identifiers. Location patient: home Location provider:work  office Persons participating in the virtual visit: patient, provider  [place] national recommendations  regarding COVID 19 pandemic   video visit is advised over in office visit for this patient.  Patient aware  of the limitations of evaluation and management by telemedicine and  availability of in person appointments. and agreed to proceed.   HPI: Shelly Fox presents for video visit  med evaluation   ADHD  on 40 mg vyvanse  seems well    still  want to stay with same .  At this time  helps focus and continue without se Taking daily ,   Mood  doing ok but has had some days of inc anxiety  inquires   consideration increase dose if on going  Citalopram   10 mg per day  currently   No change in health  School 15-17 hours    working prn 0- 16 hours  Thninking about going off ocps after  out of school ROS: See pertinent positives and negatives per HPI.  Past Medical History:  Diagnosis Date   Attention and concentration deficit    eval for add poss ocd and hyperfocus failed med trial in elem school   Seasonal allergies    rhinitis on meds     Past Surgical History:  Procedure Laterality Date   NO PAST SURGERIES      Family History  Problem Relation Age of Onset   Hyperlipidemia Father    Asthma Father    Allergies Father    Allergies Mother    Alcohol abuse Maternal Grandfather     Social History   Tobacco Use   Smoking status: Never   Smokeless tobacco: Never  Vaping Use   Vaping Use: Former  Substance Use Topics   Alcohol use: Yes    Alcohol/week: 0.0 standard drinks    Comment: Occassional   Drug use: Never      Current Outpatient Medications:    cetirizine (ZYRTEC) 10 MG tablet, Take 1 tablet (10  mg total) by mouth daily., Disp: 90 tablet, Rfl: 0   citalopram (CELEXA) 10 MG tablet, Take 1 tablet (10 mg total) by mouth daily., Disp: 90 tablet, Rfl: 1   drospirenone-ethinyl estradiol (NIKKI) 3-0.02 MG tablet, Take 1 tablet by mouth daily., Disp: 90 tablet, Rfl: 1   fluticasone (FLONASE) 50 MCG/ACT nasal spray, Place 2 sprays into both nostrils daily., Disp: 16 g, Rfl: 2   ibuprofen (ADVIL,MOTRIN) 800 MG tablet, as needed., Disp: , Rfl:    lisdexamfetamine (VYVANSE) 40 MG capsule, Take 1 capsule (40 mg total) by mouth every morning., Disp: 90 capsule, Rfl: 0   lisdexamfetamine (VYVANSE) 40 MG capsule, Take 1 capsule (40 mg total) by mouth every morning., Disp: 60 capsule, Rfl: 0  EXAM: BP Readings from Last 3 Encounters:  07/28/21 110/60  06/02/20 110/68  05/29/19 110/60    VITALS per patient if applicable:  GENERAL: alert, oriented, appears well and in no acute distress  HEENT: atraumatic, conjunttiva clear, no obvious abnormalities on inspection of external nose and ears  NECK: normal movements of the head and neck  LUNGS: on inspection no signs of respiratory distress, breathing rate appears normal, no obvious gross SOB, gasping or wheezing  CV: no obvious cyanosis  MS: moves all visible extremities without noticeable abnormality  PSYCH/NEURO: pleasant and cooperative, no obvious depression or anxiety, speech and thought processing grossly intact Lab Results  Component Value Date   WBC 7.5 05/29/2019   HGB 12.6 05/29/2019   HCT 37.7 05/29/2019   PLT 246.0 05/29/2019   GLUCOSE 83 05/29/2019   CHOL 159 05/29/2019   TRIG 125.0 05/29/2019   HDL 53.90 05/29/2019   LDLCALC 80 05/29/2019   ALT 15 05/29/2019   AST 29 05/29/2019   NA 138 05/29/2019   K 4.6 05/29/2019   CL 104 05/29/2019   CREATININE 0.95 05/29/2019   BUN 12 05/29/2019   CO2 26 05/29/2019   TSH 2.12 05/29/2019    ASSESSMENT AND PLAN:  Discussed the following assessment and plan:    ICD-10-CM    1. ADHD (attention deficit hyperactivity disorder), predominantly hyperactive impulsive type  F90.1     2. Anxiety disorder, unspecified type  F41.9     3. Medication management  Z79.899     4. Oral contraceptive use  Z30.41      Continue meds Benefit more than risk of medications  to continue. Ok to increase to 15 mg of citalopram    if feel needed  send in a refill request  at the newer dose if needed  Cont ocp   for now  discussion  Counseled.   Expectant management and discussion of plan and treatment with opportunity to ask questions and all were answered. The patient agreed with the plan and demonstrated an understanding of the instructions.   Advised to call back or seek an in-person evaluation if worsening  or having  further concerns  in interim. Return in about 6 months (around 07/19/2022) for preventive /cpx and medications.  Shanon Ace, MD

## 2022-01-20 ENCOUNTER — Encounter: Payer: Self-pay | Admitting: Internal Medicine

## 2022-01-23 ENCOUNTER — Other Ambulatory Visit: Payer: Self-pay | Admitting: Internal Medicine

## 2022-01-24 NOTE — Telephone Encounter (Signed)
Please print form for my review

## 2022-01-25 NOTE — Telephone Encounter (Signed)
Will do a letter and  get the 2018 report to  you ( not sure if can attach to my chart) .  Realize this is your private medical information  so  be aware  who you share it with.

## 2022-02-15 ENCOUNTER — Encounter: Payer: Self-pay | Admitting: Internal Medicine

## 2022-02-15 DIAGNOSIS — F901 Attention-deficit hyperactivity disorder, predominantly hyperactive type: Secondary | ICD-10-CM

## 2022-02-15 NOTE — Progress Notes (Deleted)
?  Shelly Fox has been a patient in our practice since 2016 and her last visit was January 19, 2022.  ?She carries a diagnosis of ADHD hyperactivity type F90.1 In addition to  anxiety disorder. ? ?This communication contains further information in addition to the copy of of the s last psychoeducational evaluation from August 2018 ?She was initially diagnosed by psychoeducational evaluation with ADHD in fifth grade because of ongoing hyperactivity difficulties in the classroom and home and inability to focus.  At that time a trial of medication was not helpful .  She has a past history of some tics when she was younger and was also noted to have underlying anxiety disorder. ? ?In her psychoeducational reevaluation  July 11, 2017 ,it was noted she had continued diagnosis of ADHD, but at that time was felt to have mood and anxiety difficulties aggravating her attentional difficulties.Marland Kitchen ?However ,treatment with stimulant medicine for her ADHD significantly improved her anxiety and in addition to adding citalopram 15 mg  for anxiety .  ?The combination of Vyvanse 40 mg and citalopram 15 mg has been helpful for her to adequately focus to complete her tasks study for her educational success.and minimizing side effect. The treatment plan has been most helpful during studying and exam time.However there are residual symptoms of anxiety and decreased concentration under stress. ?I  feel that Ms Warning should receive accommodation in the testing academic environment. ? ?Please extend accommodation to Ms. Shelly for freedom from distraction and extended time on testing as needed to optimize her educational success. ? ?Let us know if you require further information ? ?

## 2022-02-15 NOTE — Progress Notes (Signed)
?  Shelly Fox has been a patient in our practice since 2016 and her last visit was January 19, 2022.  ?She carries a diagnosis of ADHD hyperactivity type F90.1 In addition to  anxiety disorder. ? ?This communication contains further information in addition to the copy of of the s last psychoeducational evaluation from August 2018 ?She was initially diagnosed by psychoeducational evaluation with ADHD in fifth grade because of ongoing hyperactivity difficulties in the classroom and home and inability to focus.  At that time a trial of medication was not helpful .  She has a past history of some tics when she was younger and was also noted to have underlying anxiety disorder. ? ?In her psychoeducational reevaluation  July 11, 2017 ,it was noted she had continued diagnosis of ADHD, but at that time was felt to have mood and anxiety difficulties aggravating her attentional difficulties.. ?However ,treatment with stimulant medicine for her ADHD significantly improved her anxiety and in addition to adding citalopram 15 mg  for anxiety .  ?The combination of Vyvanse 40 mg and citalopram 15 mg has been helpful for her to adequately focus to complete her tasks study for her educational success.and minimizing side effect. The treatment plan has been most helpful during studying and exam time.However there are residual symptoms of anxiety and decreased concentration under stress. ?I  feel that Shelly Fox should receive accommodation in the testing academic environment. ? ?Please extend accommodation to Shelly Fox for freedom from distraction and extended time on testing as needed to optimize her educational success. ? ?Let us know if you require further information ? ?

## 2022-02-15 NOTE — Telephone Encounter (Signed)
I have written the letter to put in addition to a copy of your past psychological testing done and on April August 2018 ? ?Lavonne I am having a hard time printing it clearly. ?Please see if you can get help printing the letter and getting her attach a copy of her psychological testing that is scanned in from 2018 I have placed this on your desk. ? ?May have to fax it or have someone pick it up. ?

## 2022-04-20 ENCOUNTER — Ambulatory Visit (INDEPENDENT_AMBULATORY_CARE_PROVIDER_SITE_OTHER): Payer: Self-pay | Admitting: Internal Medicine

## 2022-04-20 ENCOUNTER — Encounter: Payer: Self-pay | Admitting: Internal Medicine

## 2022-04-20 VITALS — BP 102/80 | HR 57 | Temp 97.9°F | Ht 67.0 in | Wt 145.2 lb

## 2022-04-20 DIAGNOSIS — F901 Attention-deficit hyperactivity disorder, predominantly hyperactive type: Secondary | ICD-10-CM

## 2022-04-20 DIAGNOSIS — Z79899 Other long term (current) drug therapy: Secondary | ICD-10-CM

## 2022-04-20 DIAGNOSIS — R058 Other specified cough: Secondary | ICD-10-CM

## 2022-04-20 MED ORDER — LISDEXAMFETAMINE DIMESYLATE 40 MG PO CAPS: 40.0000 mg | ORAL_CAPSULE | ORAL | 0 refills | Status: DC

## 2022-04-20 NOTE — Patient Instructions (Addendum)
Exam is reassuring today .   Stay on allergy meds  through this as could be adding to this  If  progressive  pain worsening  send a message .  At this time do not think antibiotics would be helpful.   Refilled vyvanse  today

## 2022-04-20 NOTE — Progress Notes (Signed)
Chief Complaint  Patient presents with   Cough    Dry coughing up phelum taking vit c elderberry     HPI: Shelly Fox 21 y.o. come in for prolonged resp sx . Onset early May with uri type sx upper congestion nasal drainage  wo fever  improved and then worse may 18-21 again and now coughing some phelgm sob wheezing  no current sinus pain  . Some improve past days .  No hemoptysis  sob .  On zyrtec and flonase reg    Vyvanse still helpful  just started  summer school organic chem .  Anxiety meds stable at this time.  ROS: See pertinent positives and negatives per HPI.  Past Medical History:  Diagnosis Date   Attention and concentration deficit    eval for add poss ocd and hyperfocus failed med trial in elem school   Seasonal allergies    rhinitis on meds     Family History  Problem Relation Age of Onset   Hyperlipidemia Father    Asthma Father    Allergies Father    Allergies Mother    Alcohol abuse Maternal Grandfather     Social History   Socioeconomic History   Marital status: Single    Spouse name: Not on file   Number of children: Not on file   Years of education: Not on file   Highest education level: Not on file  Occupational History   Occupation: Student  Tobacco Use   Smoking status: Never   Smokeless tobacco: Never  Vaping Use   Vaping Use: Former  Substance and Sexual Activity   Alcohol use: Yes    Alcohol/week: 0.0 standard drinks    Comment: Occassional   Drug use: Never   Sexual activity: Not Currently  Other Topics Concern   Not on file  Social History Narrative   [employer] to go to [employer]	[place]   Parents divorced 2 HH   Parents james Booze mzsers Airline pilot rep aed and Air cabin crew college realestate   stepdad Clifton Custard [place]sel[place]  Step mom Haset [place]( a legal guardian reported)   Week trnasfers mom hh has 2 dogs net ets FA    Older sis   Social Determinants of Health   Financial Resource Strain: Not on file  Food Insecurity: Not on  file  Transportation Needs: Not on file  Physical Activity: Not on file  Stress: Not on file  Social Connections: Not on file    Outpatient Medications Prior to Visit  Medication Sig Dispense Refill   cetirizine (ZYRTEC) 10 MG tablet Take 1 tablet (10 mg total) by mouth daily. 90 tablet 0   citalopram (CELEXA) 10 MG tablet TAKE ONE TABLET BY MOUTH DAILY 90 tablet 1   fluticasone (FLONASE) 50 MCG/ACT nasal spray Place 2 sprays into both nostrils daily. 16 g 2   ibuprofen (ADVIL,MOTRIN) 800 MG tablet as needed.     lisdexamfetamine (VYVANSE) 40 MG capsule Take 1 capsule (40 mg total) by mouth every morning. 90 capsule 0   drospirenone-ethinyl estradiol (NIKKI) 3-0.02 MG tablet Take 1 tablet by mouth daily. (Patient not taking: Reported on 04/20/2022) 90 tablet 1   lisdexamfetamine (VYVANSE) 40 MG capsule Take 1 capsule (40 mg total) by mouth every morning. 60 capsule 0   No facility-administered medications prior to visit.     EXAM:  BP 102/80 (BP Location: Right Arm, Patient Position: Sitting, Cuff Size: Normal)   Pulse (!) 57   Temp 97.9 F (36.6  C) (Oral)   Ht 5\' 7"  (1.702 m)   Wt 145 lb 3.2 oz (65.9 kg)   LMP 04/15/2022   SpO2 99%   BMI 22.74 kg/m   Body mass index is 22.74 kg/m.  GENERAL: vitals reviewed and listed above, alert, oriented, appears well hydrated and in no acute distress HEENT: atraumatic, conjunctiva  clear, no obvious abnormalities on inspection of external nose and ears tms clear  nasal congested no facial pain or swellingOP : no lesion edema or exudate  NECK: no obvious masses on inspection palpation  LUNGS: clear to auscultation bilaterally, no wheezes, rales or rhonchi, good air movement CV: HRRR, no clubbing cyanosis or  peripheral edema nl cap refill  MS: moves all extremities without noticeable focal  abnormality PSYCH: pleasant and cooperative, no obvious depression or anxiety Lab Results  Component Value Date   WBC 7.5 05/29/2019   HGB 12.6  05/29/2019   HCT 37.7 05/29/2019   PLT 246.0 05/29/2019   GLUCOSE 83 05/29/2019   CHOL 159 05/29/2019   TRIG 125.0 05/29/2019   HDL 53.90 05/29/2019   LDLCALC 80 05/29/2019   ALT 15 05/29/2019   AST 29 05/29/2019   NA 138 05/29/2019   K 4.6 05/29/2019   CL 104 05/29/2019   CREATININE 0.95 05/29/2019   BUN 12 05/29/2019   CO2 26 05/29/2019   TSH 2.12 05/29/2019   BP Readings from Last 3 Encounters:  04/20/22 102/80  07/28/21 110/60  06/02/20 110/68   Rapid Covid test neg  ASSESSMENT AND PLAN:  Discussed the following assessment and plan:  Dry cough - Plan: POC COVID-19, POC Influenza A/B  Respiratory tract congestion with cough  ADHD (attention deficit hyperactivity disorder), predominantly hyperactive impulsive type  Medication management Either 2 separate  illness and or allergy adding . Exam is unrevealing  looks a bit allergic .  Cont allergy hygiene and if worsening pain fever  consider reeval and or antibiotic if appropriate   As far as Vyvanse  goes  Benefit more than risk of medications  to continue.same dose for now   -Patient advised to return or notify health care team  if  new concerns arise.  Patient Instructions  Exam is reassuring today .   Stay on allergy meds  through this as could be adding to this  If  progressive  pain worsening  send a message .  At this time do not think antibiotics would be helpful.   Refilled vyvanse  today    08/03/20. Lakendra Helling M.D.

## 2022-04-27 ENCOUNTER — Ambulatory Visit: Payer: Self-pay | Admitting: Internal Medicine

## 2022-06-06 ENCOUNTER — Ambulatory Visit (INDEPENDENT_AMBULATORY_CARE_PROVIDER_SITE_OTHER): Payer: Self-pay | Admitting: Internal Medicine

## 2022-06-06 VITALS — BP 110/68 | HR 63 | Temp 98.2°F | Ht 67.0 in | Wt 144.6 lb

## 2022-06-06 DIAGNOSIS — Z79899 Other long term (current) drug therapy: Secondary | ICD-10-CM

## 2022-06-06 DIAGNOSIS — L309 Dermatitis, unspecified: Secondary | ICD-10-CM

## 2022-06-06 DIAGNOSIS — B078 Other viral warts: Secondary | ICD-10-CM

## 2022-06-06 MED ORDER — TRETINOIN 0.05 % EX CREA: TOPICAL_CREAM | Freq: Every day | CUTANEOUS | 0 refills | Status: DC

## 2022-06-06 MED ORDER — TRIAMCINOLONE ACETONIDE 0.1 % EX CREA: 1.0000 | TOPICAL_CREAM | Freq: Two times a day (BID) | CUTANEOUS | 0 refills | Status: DC

## 2022-06-06 NOTE — Progress Notes (Signed)
Chief Complaint  Patient presents with   Rash    Gets worst in the summer     HPI: Shelly Fox 21 y.o. come in for  help with eczema   has had it off and on for a while as did her sister.  Over time she was Was using  her sis leftover steroid cream for neck and  ocass face was quite helpful but she is out. Rash looks good today but will get itchy and blotchy and she has pictures.  She also has a bump on her right hand dorsal that has now spread to multiple bumps. ROS: See pertinent positives and negatives per HPI. 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  Feb and fall.  Gets allergy mild sx.  Past Medical History:  Diagnosis Date   Attention and concentration deficit    eval for add poss ocd and hyperfocus failed med trial in elem school   Seasonal allergies    rhinitis on meds     Family History  Problem Relation Age of Onset   Hyperlipidemia Father    Asthma Father    Allergies Father    Allergies Mother    Alcohol abuse Maternal Grandfather     Social History   Socioeconomic History   Marital status: Single    Spouse name: Not on file   Number of children: Not on file   Years of education: Not on file   Highest education level: Bachelor's degree (e.g., BA, AB, BS)  Occupational History   Occupation: Student  Tobacco Use   Smoking status: Never   Smokeless tobacco: Never  Vaping Use   Vaping Use: Former  Substance and Sexual Activity   Alcohol use: Yes    Alcohol/week: 0.0 standard drinks of alcohol    Comment: Occassional   Drug use: Never   Sexual activity: Not Currently  Other Topics Concern   Not on file  Social History Narrative   [employer] to go to [employer]	[place]   Parents divorced 2 HH   Parents james Meyn mzsers Airline pilot rep aed and Air cabin crew college realestate   stepdad Clifton Custard Zseltvay  Step mom Arpi Diebold( a legal guardian reported)   Week trnasfers mom hh  has 2 dogs net ets FA    Older sis   Social Determinants of Health   Financial Resource Strain: Low Risk  (06/06/2022)   Overall Financial Resource Strain (CARDIA)    Difficulty of Paying Living Expenses: Not hard at all  Food Insecurity: No Food Insecurity (06/06/2022)   Hunger Vital Sign    Worried About Running Out of Food in the Last Year: Never true    Ran Out of Food in the Last Year: Never true  Transportation Needs: No Transportation Needs (06/06/2022)   PRAPARE - Administrator, Civil Service (Medical): No    Lack of Transportation (Non-Medical): No  Physical Activity: Sufficiently Active (06/06/2022)   Exercise Vital Sign    Days of Exercise per Week: 5 days    Minutes of Exercise per Session: 60 min  Stress: No Stress Concern Present (06/06/2022)   [employer] of Occupational Health - Occupational Stress Questionnaire    Feeling of Stress : Only a little  Social Connections: Moderately Integrated (06/06/2022)   Social Connection and Isolation Panel [NHANES]    Frequency of Communication with Friends and Family: More than three times a week  Frequency of Social Gatherings with Friends and Family: More than three times a week    Attends Religious Services: More than 4 times per year    Active Member of Clubs or Organizations: Yes    Attends Banker Meetings: 1 to 4 times per year    Marital Status: Never married    Outpatient Medications Prior to Visit  Medication Sig Dispense Refill   cetirizine (ZYRTEC) 10 MG tablet Take 1 tablet (10 mg total) by mouth daily. 90 tablet 0   citalopram (CELEXA) 10 MG tablet TAKE ONE TABLET BY MOUTH DAILY 90 tablet 1   fluticasone (FLONASE) 50 MCG/ACT nasal spray Place 2 sprays into both nostrils daily. 16 g 2   ibuprofen (ADVIL,MOTRIN) 800 MG tablet as needed.     lisdexamfetamine (VYVANSE) 40 MG capsule Take 1 capsule (40 mg total) by mouth every morning. 90 capsule 0   drospirenone-ethinyl estradiol (NIKKI)  3-0.02 MG tablet Take 1 tablet by mouth daily. (Patient not taking: Reported on 04/20/2022) 90 tablet 1   No facility-administered medications prior to visit.     EXAM:  BP 110/68 (BP Location: Left Arm, Patient Position: Sitting, Cuff Size: Normal)   Pulse 63   Temp 98.2 F (36.8 C) (Oral)   Ht 5\' 7"  (1.702 m)   Wt 144 lb 9.6 oz (65.6 kg)   LMP 06/05/2022   SpO2 99%   BMI 22.65 kg/m   Body mass index is 22.65 kg/m.  GENERAL: vitals reviewed and listed above, alert, oriented, appears well hydrated and in no acute distress HEENT: atraumatic, conjunctiva  clear, no obvious abnormalities on inspection of external nose and ears  Skin faded redness blotchiness on the neck mostly anterior.  Antecubital area is clear right hand dorsum wrist with feels like rough flat warts with multiple little warts.  Pink in color. PSYCH: pleasant and cooperative, no obvious depression or anxiety  BP Readings from Last 3 Encounters:  06/06/22 110/68  04/20/22 102/80  07/28/21 110/60    ASSESSMENT AND PLAN:  Discussed the following assessment and plan:  Eczema, unspecified type - Reported as atopic dermatitis steroid responsive reviewed use of steroids to avoid rebound but controlled problem  Flat warts - Suspected try over-the-counter's or the topical Retin-A prescribed May take weeks to months also get Derm to look ;other options mention  Medication management  -Patient advised to return or notify health care team  if  new concerns arise.  Patient Instructions  Cna use steroid cream twice a day as needed and moisturize.  Not on face.  Can try the retin a medication on hand bums that I think are flat warts  once or twice a day   . It will irritate the skin   Can ask dermatology also to check .    07/30/21. Izreal Kock M.D.

## 2022-06-06 NOTE — Patient Instructions (Signed)
Cna use steroid cream twice a day as needed and moisturize.  Not on face.  Can try the retin a medication on hand bums that I think are flat warts  once or twice a day   . It will irritate the skin   Can ask dermatology also to check .

## 2022-06-07 ENCOUNTER — Other Ambulatory Visit: Payer: Self-pay

## 2022-06-22 ENCOUNTER — Other Ambulatory Visit: Payer: Self-pay

## 2022-06-22 ENCOUNTER — Encounter: Payer: Self-pay | Admitting: Pharmacist

## 2022-07-12 ENCOUNTER — Other Ambulatory Visit: Payer: Self-pay

## 2022-07-21 ENCOUNTER — Other Ambulatory Visit: Payer: Self-pay | Admitting: Internal Medicine

## 2022-09-08 ENCOUNTER — Other Ambulatory Visit: Payer: Self-pay | Admitting: Internal Medicine

## 2022-09-12 MED ORDER — LISDEXAMFETAMINE DIMESYLATE 40 MG PO CAPS: 40.0000 mg | ORAL_CAPSULE | ORAL | 0 refills | Status: DC

## 2022-09-13 ENCOUNTER — Other Ambulatory Visit: Payer: Self-pay

## 2022-09-13 ENCOUNTER — Other Ambulatory Visit: Payer: Self-pay | Admitting: Internal Medicine

## 2022-09-13 MED ORDER — FLUTICASONE PROPIONATE 50 MCG/ACT NA SUSP
2.0000 | Freq: Every day | NASAL | 0 refills | Status: AC
  Filled 2022-09-13: qty 16, 30d supply, fill #0

## 2022-09-13 NOTE — Telephone Encounter (Signed)
Contact patient to schedule a physical exam. Patient states she will call us back to schedule an appointment.

## 2022-09-16 ENCOUNTER — Encounter: Payer: Self-pay | Admitting: Radiology

## 2022-09-23 ENCOUNTER — Ambulatory Visit (INDEPENDENT_AMBULATORY_CARE_PROVIDER_SITE_OTHER): Payer: Self-pay | Admitting: Radiology

## 2022-09-23 ENCOUNTER — Other Ambulatory Visit (HOSPITAL_COMMUNITY)
Admission: RE | Admit: 2022-09-23 | Discharge: 2022-09-23 | Disposition: A | Discharge status: Home / Self Care | Payer: Self-pay | Source: Ambulatory Visit | Attending: Radiology | Admitting: Radiology

## 2022-09-23 ENCOUNTER — Encounter: Payer: Self-pay | Admitting: Radiology

## 2022-09-23 VITALS — BP 106/58 | Ht 66.5 in | Wt 145.0 lb

## 2022-09-23 DIAGNOSIS — Z01419 Encounter for gynecological examination (general) (routine) without abnormal findings: Secondary | ICD-10-CM | POA: Insufficient documentation

## 2022-09-23 NOTE — Progress Notes (Signed)
   Shelly Fox [birth year] [id number]   History:  21 y.o. G0 presents for annual exam as a new patient. No gyn concerns. Not sexually active. Applying to [place].  Gynecologic History Patient's last menstrual period was 09/14/2022 (exact date). Period Cycle (Days): 28 Period Duration (Days): 5 Period Pattern: Regular Menstrual Flow: Light (light to moderate) Menstrual Control: Tampon, Thin pad, Maxi pad Dysmenorrhea: None Contraception/Family planning: abstinence Sexually active: yes   Obstetric History OB History  Gravida Para Term Preterm AB Living  0 0 0 0 0 0  SAB IAB Ectopic Multiple Live Births  0 0 0 0       The following portions of the patient's history were reviewed and updated as appropriate: allergies, current medications, past family history, past medical history, past social history, past surgical history, and problem list.  Review of Systems Pertinent items noted in HPI and remainder of comprehensive ROS otherwise negative.   Past medical history, past surgical history, family history and social history were all reviewed and documented in the EPIC chart.   Exam:  Vitals:   09/23/22 1316  BP: (!) 106/58  Weight: 145 lb (65.8 kg)  Height: 5' 6.5" (1.689 m)   Body mass index is 23.05 kg/m.  General appearance:  Normal Thyroid:  Symmetrical, normal in size, without palpable masses or nodularity. Respiratory  Auscultation:  Clear without wheezing or rhonchi Cardiovascular  Auscultation:  Regular rate, without rubs, murmurs or gallops  Edema/varicosities:  Not grossly evident Abdominal  Soft,nontender, without masses, guarding or rebound.  Liver/spleen:  No organomegaly noted  Hernia:  None appreciated  Skin  Inspection:  Grossly normal Breasts: Examined lying and sitting.   Right: Without masses, retractions, nipple discharge or axillary adenopathy.   Left: Without masses, retractions, nipple discharge or axillary adenopathy. Genitourinary    Inguinal/mons:  Normal without inguinal adenopathy  External genitalia:  Normal appearing vulva with no masses, tenderness, or lesions  BUS/Urethra/Skene's glands:  Normal without masses or exudate  Vagina:  Normal appearing with normal color and discharge, no lesions  Cervix:  Normal appearing without discharge or lesions  Uterus:  Normal in size, shape and contour.  Mobile, nontender  Adnexa/parametria:     Rt: Normal in size, without masses or tenderness.   Lt: Normal in size, without masses or tenderness.  Anus and perineum: Normal   Patient informed chaperone available to be present for breast and pelvic exam. Patient has requested no chaperone to be present. Patient has been advised what will be completed during breast and pelvic exam.   Assessment/Plan:   1. Well female exam with routine gynecological exam  - Cytology - PAP( [facility])      Discussed SBE,pap guidelines and STI screening as directed/appropriate. Recommend 116mins of exercise weekly, including weight bearing exercise. Encouraged the use of seatbelts and sunscreen. Return in 1 year for annual or as needed.   Rubbie Battiest B WHNP-BC 2:09 PM 09/23/2022

## 2022-09-27 ENCOUNTER — Other Ambulatory Visit: Payer: Self-pay

## 2022-09-27 LAB — CYTOLOGY - PAP: Diagnosis: NEGATIVE

## 2022-11-23 ENCOUNTER — Telehealth: Payer: Self-pay | Admitting: Internal Medicine

## 2022-11-25 ENCOUNTER — Encounter: Payer: Self-pay | Admitting: Family Medicine

## 2022-11-25 ENCOUNTER — Telehealth (INDEPENDENT_AMBULATORY_CARE_PROVIDER_SITE_OTHER): Payer: Self-pay | Admitting: Family Medicine

## 2022-11-25 ENCOUNTER — Telehealth: Payer: Self-pay | Admitting: Family Medicine

## 2022-11-25 DIAGNOSIS — F901 Attention-deficit hyperactivity disorder, predominantly hyperactive type: Secondary | ICD-10-CM

## 2022-11-25 MED ORDER — LISDEXAMFETAMINE DIMESYLATE 10 MG PO CAPS: 10.0000 mg | ORAL_CAPSULE | Freq: Every day | ORAL | 0 refills | Status: DC

## 2022-11-25 NOTE — Progress Notes (Signed)
Subjective:    Patient ID: Shelly Fox, female    DOB: 04/16/2001, 21 y.o.   MRN: [id number]  HPI Virtual Visit via Video Note  I connected with the patient on 11/25/22 at  1:15 PM EST by a video enabled telemedicine application and verified that I am speaking with the correct person using two identifiers.  Location patient: home Location provider:work or home office Persons participating in the virtual visit: patient, provider  I discussed the limitations of evaluation and management by telemedicine and the availability of in person appointments. The patient expressed understanding and agreed to proceed.   HPI: Here to follow up on ADHD. She has been taking 40 mg daily for several years, and she tolerates it well. Appetite and sleep are intact. However it does not work quite as well as it did. She asks to increase this to 50 mg.    ROS: See pertinent positives and negatives per HPI.  Past Medical History:  Diagnosis Date   Attention and concentration deficit    eval for add poss ocd and hyperfocus failed med trial in elem school   Seasonal allergies    rhinitis on meds     Past Surgical History:  Procedure Laterality Date   MOUTH SURGERY     2010, tooth extraction   NO PAST SURGERIES      Family History  Problem Relation Age of Onset   Hyperlipidemia Father    Asthma Father    Allergies Father    Allergies Mother    Alcohol abuse Maternal Grandfather      Current Outpatient Medications:    cetirizine (ZYRTEC) 10 MG tablet, Take 1 tablet (10 mg total) by mouth daily., Disp: 90 tablet, Rfl: 0   citalopram (CELEXA) 10 MG tablet, TAKE ONE TABLET BY MOUTH DAILY, Disp: 90 tablet, Rfl: 1   fluticasone (FLONASE) 50 MCG/ACT nasal spray, Place 2 sprays into both nostrils daily., Disp: 16 g, Rfl: 0   ibuprofen (ADVIL,MOTRIN) 800 MG tablet, as needed., Disp: , Rfl:    lisdexamfetamine (VYVANSE) 10 MG capsule, Take 1 capsule (10 mg total) by mouth daily., Disp: 30 capsule,  Rfl: 0   lisdexamfetamine (VYVANSE) 40 MG capsule, Take 1 capsule (40 mg total) by mouth every morning., Disp: 90 capsule, Rfl: 0   tretinoin (RETIN-A) 0.05 % cream, Apply topically at bedtime. Or as directed, Disp: 20 g, Rfl: 0   triamcinolone cream (KENALOG) 0.1 %, Apply 1 Application topically 2 (two) times daily. As directed for  eczema, Disp: 30 g, Rfl: 0  EXAM:  VITALS per patient if applicable:  GENERAL: alert, oriented, appears well and in no acute distress  HEENT: atraumatic, conjunttiva clear, no obvious abnormalities on inspection of external nose and ears  NECK: normal movements of the head and neck  LUNGS: on inspection no signs of respiratory distress, breathing rate appears normal, no obvious gross SOB, gasping or wheezing  CV: no obvious cyanosis  MS: moves all visible extremities without noticeable abnormality  PSYCH/NEURO: pleasant and cooperative, no obvious depression or anxiety, speech and thought processing grossly intact  ASSESSMENT AND PLAN: ADHD. We will increase her Vyvance to a total of 50 mg daily. She has some 40 mg capsules left so we will add a 10 mg capsule to this each day. She will report back to [place] in 30 days. Gershon Crane, MD  Discussed the following assessment and plan:  No diagnosis found.     I discussed the assessment and treatment plan  with the patient. The patient was provided an opportunity to ask questions and all were answered. The patient agreed with the plan and demonstrated an understanding of the instructions.   The patient was advised to call back or seek an in-person evaluation if the symptoms worsen or if the condition fails to improve as anticipated.      Review of Systems     Objective:   Physical Exam        Assessment & Plan:

## 2022-12-23 ENCOUNTER — Encounter: Payer: Self-pay | Admitting: Family Medicine

## 2022-12-27 MED ORDER — LISDEXAMFETAMINE DIMESYLATE 50 MG PO CAPS: 50.0000 mg | ORAL_CAPSULE | Freq: Every day | ORAL | 0 refills | Status: DC

## 2022-12-27 NOTE — Telephone Encounter (Signed)
Done

## 2023-01-03 ENCOUNTER — Telehealth: Payer: Self-pay | Admitting: Internal Medicine

## 2023-01-03 NOTE — Telephone Encounter (Signed)
Prescription Request  01/03/2023  Is this a "Controlled Substance" medicine? No  LOV: 06/06/2022  What is the name of the medication or equipment? citalopram (CELEXA) 10 MG tablet   Have you contacted your pharmacy to request a refill? Yes   Which pharmacy would you like this sent to?  [facility] [id number], [place] [address] Phone: [phone] Fax: [phone]    Patient notified that their request is being sent to the clinical staff for review and that they should receive a response within 2 business days.   Please advise at Mobile [phone] (mobile)

## 2023-01-04 MED ORDER — CITALOPRAM HYDROBROMIDE 10 MG PO TABS: 10.0000 mg | ORAL_TABLET | Freq: Every day | ORAL | 0 refills | Status: DC

## 2023-01-04 NOTE — Telephone Encounter (Signed)
Please refill 60 days days  and have her make appt before runs out.

## 2023-01-04 NOTE — Addendum Note (Signed)
Addended byEncarnacion Slates on: 01/04/2023 05:01 PM   Modules accepted: Orders

## 2023-01-04 NOTE — Telephone Encounter (Signed)
Rx sent for 60 days.  Spoke to patient and made aware of advise from Dr. Regis Bill.  Offer to schedule an appt. Pt states she will do it later via mychart. No further action is needed.

## 2023-01-18 ENCOUNTER — Telehealth (INDEPENDENT_AMBULATORY_CARE_PROVIDER_SITE_OTHER): Payer: Self-pay | Admitting: Internal Medicine

## 2023-01-18 VITALS — Ht 66.5 in | Wt 145.0 lb

## 2023-01-18 DIAGNOSIS — Z79899 Other long term (current) drug therapy: Secondary | ICD-10-CM

## 2023-01-18 DIAGNOSIS — F419 Anxiety disorder, unspecified: Secondary | ICD-10-CM

## 2023-01-18 DIAGNOSIS — F901 Attention-deficit hyperactivity disorder, predominantly hyperactive type: Secondary | ICD-10-CM

## 2023-01-18 MED ORDER — CITALOPRAM HYDROBROMIDE 20 MG PO TABS: 20.0000 mg | ORAL_TABLET | Freq: Every day | ORAL | 1 refills | Status: DC

## 2023-01-18 MED ORDER — LISDEXAMFETAMINE DIMESYLATE 50 MG PO CAPS: 50.0000 mg | ORAL_CAPSULE | Freq: Every day | ORAL | 0 refills | Status: DC

## 2023-01-18 NOTE — Progress Notes (Signed)
Virtual Visit via Video Note  I connected with Shelly Fox on 01/19/23 at 12:15 PM EST by a video enabled telemedicine application and verified that I am speaking with the correct person using two identifiers. Location patient: vehicl Location provider:work office Persons participating in the virtual visit: patient, provider  Patient aware  of the limitations of evaluation and management by telemedicine and  availability of in person appointments. and agreed to proceed.   HPI: Shelly Fox presents for video visit for fu meds eval  Had vv dr Sarajane Jews 12 29 in my absence   trial of inc dose 50 gm  10 plus 40  vyvanse for adhd sx  Working on sleep sometimes 7 prev 5  Had gres recnetly . Lots of stresss  In cital;opram to 20 per day and some help switch  anxiety although still  present. Life elong mood iissues some worse at timnes but functioning busy .  No other health change Neg tad ROS: See pertinent positives and negatives per HPI.  Past Medical History:  Diagnosis Date   Attention and concentration deficit    eval for add poss ocd and hyperfocus failed med trial in elem school   Seasonal allergies    rhinitis on meds     Past Surgical History:  Procedure Laterality Date   MOUTH SURGERY     2010, tooth extraction   NO PAST SURGERIES      Family History  Problem Relation Age of Onset   Hyperlipidemia Father    Asthma Father    Allergies Father    Allergies Mother    Alcohol abuse Maternal Grandfather     Social History   Tobacco Use   Smoking status: Some Days    Types: Cigarettes    Passive exposure: Never   Smokeless tobacco: Never   Tobacco comments:    VAPES  Vaping Use   Vaping Use: Former  Substance Use Topics   Alcohol use: Yes    Alcohol/week: 0.0 standard drinks of alcohol    Comment: Occassional   Drug use: Yes    Types: Marijuana      Current Outpatient Medications:    cetirizine (ZYRTEC) 10 MG tablet, Take 1 tablet (10 mg total) by  mouth daily., Disp: 90 tablet, Rfl: 0   citalopram (CELEXA) 20 MG tablet, Take 1 tablet (20 mg total) by mouth daily., Disp: 90 tablet, Rfl: 1   fluticasone (FLONASE) 50 MCG/ACT nasal spray, Place 2 sprays into both nostrils daily., Disp: 16 g, Rfl: 0   ibuprofen (ADVIL,MOTRIN) 800 MG tablet, as needed., Disp: , Rfl:    ISOtretinoin (ACCUTANE) 40 MG capsule, , Disp: , Rfl:    triamcinolone cream (KENALOG) 0.1 %, Apply 1 Application topically 2 (two) times daily. As directed for  eczema, Disp: 30 g, Rfl: 0   lisdexamfetamine (VYVANSE) 50 MG capsule, Take 1 capsule (50 mg total) by mouth daily., Disp: 30 capsule, Rfl: 0  EXAM: BP Readings from Last 3 Encounters:  09/23/22 (!) 106/58  06/06/22 110/68  04/20/22 102/80    VITALS per patient if applicable:  GENERAL: alert, oriented, appears well and in no acute distress  HEENT: atraumatic, conjunttiva clear, no obvious abnormalities on inspection of external nose and ears  NECK: normal movements of the head and neck  LUNGS: on inspection no signs of respiratory distress, breathing rate appears normal, no obvious gross SOB, gasping or wheezing  CV: no obvious cyanosis PSYCH/NEURO: pleasant and cooperative, no obvious depression or anxiety,  speech and thought processing grossly intact Lab Results  Component Value Date   WBC 7.5 05/29/2019   HGB 12.6 05/29/2019   HCT 37.7 05/29/2019   PLT 246.0 05/29/2019   GLUCOSE 83 05/29/2019   CHOL 159 05/29/2019   TRIG 125.0 05/29/2019   HDL 53.90 05/29/2019   LDLCALC 80 05/29/2019   ALT 15 05/29/2019   AST 29 05/29/2019   NA 138 05/29/2019   K 4.6 05/29/2019   CL 104 05/29/2019   CREATININE 0.95 05/29/2019   BUN 12 05/29/2019   CO2 26 05/29/2019   TSH 2.12 05/29/2019    ASSESSMENT AND PLAN:  Discussed the following assessment and plan:    ICD-10-CM   1. ADHD (attention deficit hyperactivity disorder), predominantly hyperactive impulsive type  F90.1     2. Medication management   Z79.899     3. Anxiety disorder, unspecified type  F41.9     For now maintain on vyvanse 50  Citalopram 20 per day   Agree getting in with therapist for  mood depression ( not severe) many external factors  taking test work etc.  ROV in 3 mnos med check  Keep [place] informed in interim and can send messages or another virutal about progress  Counseled.   Expectant management and discussion of plan and treatment with opportunity to ask questions and all were answered. The patient agreed with the plan and demonstrated an understanding of the instructions.   Advised to call back or seek an in-person evaluation if worsening  or having  further concerns  in interim. Return in about 3 months (around 04/18/2023) for or as needed .    Shanon Ace, MD

## 2023-01-19 ENCOUNTER — Encounter: Payer: Self-pay | Admitting: Internal Medicine

## 2023-03-02 ENCOUNTER — Other Ambulatory Visit: Payer: Self-pay | Admitting: Internal Medicine

## 2023-03-07 ENCOUNTER — Other Ambulatory Visit: Payer: Self-pay | Admitting: Internal Medicine

## 2023-03-08 MED ORDER — LISDEXAMFETAMINE DIMESYLATE 50 MG PO CAPS: 50.0000 mg | ORAL_CAPSULE | Freq: Every day | ORAL | 0 refills | Status: DC

## 2023-03-08 NOTE — Telephone Encounter (Signed)
Requesting: Vyvanse 50mg   Contract: None UDS: None Last Visit: 01/18/23 Next Visit: None Last Refill: 01/18/23 #30 and 0RF  Please Advise

## 2023-03-15 ENCOUNTER — Telehealth: Payer: Self-pay | Admitting: Internal Medicine

## 2023-03-15 NOTE — Telephone Encounter (Signed)
Pt dad Fayrene Fearing is calling and pt would like a generic vyvanse 50 mg chewable please send to  [id number] [place], [place] - [address] Phone: [phone]  Fax: [phone]

## 2023-03-19 NOTE — Telephone Encounter (Signed)
So please confrim from pharmacy that they can fill vyvanse 50 chewable  If I send in to the reported pharmacy.  Then I will send in.

## 2023-03-20 NOTE — Telephone Encounter (Signed)
Attempted to reach pt. Left a voicemail to contact us.

## 2023-03-20 NOTE — Telephone Encounter (Signed)
Pt is return karpuih call 

## 2023-03-21 MED ORDER — LISDEXAMFETAMINE DIMESYLATE 50 MG PO CHEW: 50.0000 mg | CHEWABLE_TABLET | Freq: Every day | ORAL | 0 refills | Status: DC

## 2023-03-21 NOTE — Addendum Note (Signed)
Addended byMadelin Headings on: 03/21/2023 09:18 AM   Modules accepted: Orders

## 2023-03-21 NOTE — Telephone Encounter (Signed)
Spoke to patient. [facility] [address]   [facility] [address]

## 2023-03-23 ENCOUNTER — Encounter: Payer: Self-pay | Admitting: Internal Medicine

## 2023-03-23 ENCOUNTER — Telehealth: Payer: Self-pay | Admitting: Internal Medicine

## 2023-03-23 NOTE — Telephone Encounter (Signed)
Pt requesting this prescription be redirected to  [id number] [place], [place] - [address] Phone: [phone]  Fax: [phone]

## 2023-03-23 NOTE — Telephone Encounter (Signed)
Follow up with pt. Pt is unsure the reason why to redirect med to different pharmacy.   Pt [facility] [address] for now. Will call us back if need to change it.

## 2023-03-23 NOTE — Telephone Encounter (Signed)
[facility] is calling and they do not have Lisdexamfetamine Dimesylate (VYVANSE) 50 MG CHEW  please sent rx [facility]  [id number] [place], [place] - [address] Phone: [phone]  Fax: [phone]

## 2023-03-24 ENCOUNTER — Other Ambulatory Visit: Payer: Self-pay | Admitting: Family

## 2023-03-24 MED ORDER — LISDEXAMFETAMINE DIMESYLATE 50 MG PO CHEW: 50.0000 mg | CHEWABLE_TABLET | Freq: Every day | ORAL | 0 refills | Status: DC

## 2023-03-26 NOTE — Telephone Encounter (Signed)
This is the second rx send in .  Need confirmation  of which prep and where to send to make sure pharmacy has stock before we send in another  rx for CS

## 2023-03-27 NOTE — Telephone Encounter (Signed)
Spoke to pt to follow up. Pt reports she already pick up the prescription.

## 2023-04-18 ENCOUNTER — Encounter: Payer: Self-pay | Admitting: Internal Medicine

## 2023-05-03 ENCOUNTER — Encounter: Payer: Self-pay | Admitting: Internal Medicine

## 2023-05-03 ENCOUNTER — Telehealth (INDEPENDENT_AMBULATORY_CARE_PROVIDER_SITE_OTHER): Payer: Self-pay | Admitting: Internal Medicine

## 2023-05-03 VITALS — Wt 146.0 lb

## 2023-05-03 DIAGNOSIS — F419 Anxiety disorder, unspecified: Secondary | ICD-10-CM

## 2023-05-03 DIAGNOSIS — Z79899 Other long term (current) drug therapy: Secondary | ICD-10-CM

## 2023-05-03 DIAGNOSIS — F901 Attention-deficit hyperactivity disorder, predominantly hyperactive type: Secondary | ICD-10-CM

## 2023-05-03 MED ORDER — LISDEXAMFETAMINE DIMESYLATE 50 MG PO CHEW: 50.0000 mg | CHEWABLE_TABLET | Freq: Every day | ORAL | 0 refills | Status: DC

## 2023-05-03 MED ORDER — CITALOPRAM HYDROBROMIDE 20 MG PO TABS: 20.0000 mg | ORAL_TABLET | Freq: Every day | ORAL | 1 refills | Status: DC

## 2023-05-03 NOTE — Assessment & Plan Note (Signed)
Ok to remain on vyvanse 50  chewable for cost reasons

## 2023-05-03 NOTE — Assessment & Plan Note (Signed)
Continue citalopram. 

## 2023-05-03 NOTE — Progress Notes (Signed)
Virtual Visit via Video Note  I connected with Shelly Fox on 05/03/23 at 12:15 PM EDT by a video enabled telemedicine application and verified that I am speaking with the correct person using two identifiers. Location patient: vehicle  Location provider:work  office Persons participating in the virtual visit: patient, provider  Patient aware  of the limitations of evaluation and management by telemedicine and  availability of in person appointments. and agreed to proceed.   HPI: Shelly Fox presents for video visit for med check  feels that she is doing well . On citalopram and vyanse   using chewable because able to get at less cost . At this time  is self pay currently.  Last video was 2 24  vyvanse 50 and citalopram 20  now here for 3 mos med check  50 did better than 40  may consider trying higher in future  but for now  current seems to be doing well. 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  ROS: See pertinent positives and negatives per HPI.  Past Medical History:  Diagnosis Date   Attention and concentration deficit    eval for add poss ocd and hyperfocus failed med trial in elem school   Seasonal allergies    rhinitis on meds     Past Surgical History:  Procedure Laterality Date   MOUTH SURGERY     2010, tooth extraction   NO PAST SURGERIES      Family History  Problem Relation Age of Onset   Hyperlipidemia Father    Asthma Father    Allergies Father    Allergies Mother    Alcohol abuse Maternal Grandfather     Social History   Tobacco Use   Smoking status: Some Days    Types: Cigarettes    Passive exposure: Never   Smokeless tobacco: Never   Tobacco comments:    VAPES  Vaping Use   Vaping Use: Former  Substance Use Topics   Alcohol use: Yes    Alcohol/week: 0.0 standard drinks of alcohol    Comment: Occassional   Drug use: Yes    Types: Marijuana      Current Outpatient Medications:     cetirizine (ZYRTEC) 10 MG tablet, Take 1 tablet (10 mg total) by mouth daily., Disp: 90 tablet, Rfl: 0   fluticasone (FLONASE) 50 MCG/ACT nasal spray, Place 2 sprays into both nostrils daily., Disp: 16 g, Rfl: 0   ibuprofen (ADVIL,MOTRIN) 800 MG tablet, as needed., Disp: , Rfl:    triamcinolone cream (KENALOG) 0.1 %, Apply 1 Application topically 2 (two) times daily. As directed for  eczema, Disp: 30 g, Rfl: 0   citalopram (CELEXA) 20 MG tablet, Take 1 tablet (20 mg total) by mouth daily., Disp: 90 tablet, Rfl: 1   Lisdexamfetamine Dimesylate (VYVANSE) 50 MG CHEW, Chew 1 tablet (50 mg total) by mouth daily., Disp: 30 tablet, Rfl: 0  EXAM: BP Readings from Last 3 Encounters:  09/23/22 (!) 106/58  06/06/22 110/68  04/20/22 102/80    VITALS per patient if applicable:  GENERAL: alert, oriented, appears well and in no acute distress  HEENT: atraumatic, conjunttiva clear, no obvious abnormalities on inspection of external nose and ears  NECK: normal movements of the head and neck  LUNGS: on inspection no signs of respiratory distress, breathing rate appears normal, no obvious gross SOB, gasping or wheezing  CV: no obvious cyanosis  MS: moves all visible  extremities without noticeable abnormality  PSYCH/NEURO: pleasant and cooperative, no obvious depression or anxiety, speech and thought processing grossly intact Lab Results  Component Value Date   WBC 7.5 05/29/2019   HGB 12.6 05/29/2019   HCT 37.7 05/29/2019   PLT 246.0 05/29/2019   GLUCOSE 83 05/29/2019   CHOL 159 05/29/2019   TRIG 125.0 05/29/2019   HDL 53.90 05/29/2019   LDLCALC 80 05/29/2019   ALT 15 05/29/2019   AST 29 05/29/2019   NA 138 05/29/2019   K 4.6 05/29/2019   CL 104 05/29/2019   CREATININE 0.95 05/29/2019   BUN 12 05/29/2019   CO2 26 05/29/2019   TSH 2.12 05/29/2019    ASSESSMENT AND PLAN:  Discussed the following assessment and plan:    ICD-10-CM   1. ADHD (attention deficit hyperactivity disorder),  predominantly hyperactive impulsive type  F90.1     2. Medication management  Z79.899     3. Anxiety disorder, unspecified type  F41.9       Counseled.   Expectant management and discussion of plan and treatment with opportunity to ask questions and all were answered. The patient agreed with the plan and demonstrated an understanding of the instructions.   Advised to call back or seek an in-person evaluation if worsening  or having  further concerns  in interim. Return in about 6 months (around 11/02/2023) for medication.  Berniece Andreas, MD

## 2023-05-08 ENCOUNTER — Other Ambulatory Visit: Payer: Self-pay | Admitting: Family

## 2023-05-08 MED ORDER — LISDEXAMFETAMINE DIMESYLATE 50 MG PO CHEW: 50.0000 mg | CHEWABLE_TABLET | Freq: Every day | ORAL | 0 refills | Status: DC

## 2023-05-08 NOTE — Telephone Encounter (Signed)
Pt is aware md out of office and checking on getting vyvanse

## 2023-05-17 NOTE — Telephone Encounter (Signed)
I agree with plan

## 2023-06-11 NOTE — Telephone Encounter (Signed)
Call te pharmacy  P webb ordered 90 days  in June .  Why is patient reporting that she  only got 30 days?    Pharmacy needs to document that she only got 30  Need to invesigate and let me know so I can order this med if  she is really out.

## 2023-06-12 ENCOUNTER — Telehealth: Payer: Self-pay | Admitting: Internal Medicine

## 2023-06-12 ENCOUNTER — Other Ambulatory Visit: Payer: Self-pay | Admitting: Family

## 2023-06-12 MED ORDER — LISDEXAMFETAMINE DIMESYLATE 50 MG PO CHEW: 50.0000 mg | CHEWABLE_TABLET | Freq: Every day | ORAL | 0 refills | Status: DC

## 2023-06-12 NOTE — Telephone Encounter (Signed)
Prescription Request  06/12/2023  LOV: Visit date not found  What is the name of the medication or equipment?  Lisdexamfetamine Dimesylate Lisdexamfetamine Dimesylate (VYVANSE) 50 MG CHEW  Pt states she is completely out. Pt is requesting a 90 day supply.  Pt informed MD is OOO on Mondays.  Please advise.   Have you contacted your pharmacy to request a refill? Yes   Which pharmacy would you like this sent to?   [id number] [place], [place] - [address] [place] [address] Phone: [phone] Fax: [phone]    Patient notified that their request is being sent to the clinical staff for review and that they should receive a response within 2 business days.   Please advise at Mobile [phone] (mobile)

## 2023-07-15 ENCOUNTER — Other Ambulatory Visit: Payer: Self-pay | Admitting: Internal Medicine

## 2023-10-25 ENCOUNTER — Encounter: Payer: Self-pay | Admitting: Internal Medicine

## 2023-10-30 MED ORDER — LISDEXAMFETAMINE DIMESYLATE 50 MG PO CHEW: 50.0000 mg | CHEWABLE_TABLET | Freq: Every day | ORAL | 0 refills | Status: DC

## 2023-10-30 NOTE — Telephone Encounter (Signed)
I will  be ok to refill med  but  please clarify  if she is still taking the chewable Vyvanse 50 mg and  confirm the pharmacy . Also have her make a med check appt  to be completed before medication runs out.

## 2023-10-30 NOTE — Telephone Encounter (Signed)
[facility]  FYI Federal law limits fill to 90 days  but I will refill  if visit  appt   every 6 months .

## 2023-10-30 NOTE — Telephone Encounter (Signed)
Spoke to pt. Pt confirms that she is still taking chewable vyvanse 50mg  and [facility] [address]   Pt reports she did not take the medicine for 2-3 months because she wasn't able to get it. And she just started taking it 3-4 wks ago.  Pt reports she usually received 6 months supply from Dr. Fabian Sharp. Pt wants to know if provider  can continue to supply her at the same supply amount.   Advise pt to schedule an appointment  before her medication runs out. Pt verbalized understanding. Offer to schedule an appt. Pt states she is at work and will call back to schedule for one.

## 2023-12-05 ENCOUNTER — Telehealth: Payer: Self-pay

## 2023-12-05 NOTE — Telephone Encounter (Signed)
 Attempted to reach pt to see if pt can move her appt to an earlier time at 9:15am virtual. Left a a voicemail to call us back.

## 2023-12-06 ENCOUNTER — Encounter: Payer: Self-pay | Admitting: Internal Medicine

## 2023-12-06 VITALS — Ht 66.5 in | Wt 146.0 lb

## 2023-12-06 DIAGNOSIS — F901 Attention-deficit hyperactivity disorder, predominantly hyperactive type: Secondary | ICD-10-CM

## 2023-12-06 DIAGNOSIS — Z79899 Other long term (current) drug therapy: Secondary | ICD-10-CM

## 2023-12-06 NOTE — Progress Notes (Signed)
 Patient rescheduled because our clinic appts were behind  for med evaluation.

## 2023-12-12 ENCOUNTER — Telehealth (INDEPENDENT_AMBULATORY_CARE_PROVIDER_SITE_OTHER): Payer: Self-pay | Admitting: Internal Medicine

## 2023-12-12 ENCOUNTER — Encounter: Payer: Self-pay | Admitting: Internal Medicine

## 2023-12-12 VITALS — Ht 66.5 in | Wt 146.0 lb

## 2023-12-12 DIAGNOSIS — Z79899 Other long term (current) drug therapy: Secondary | ICD-10-CM

## 2023-12-12 DIAGNOSIS — F419 Anxiety disorder, unspecified: Secondary | ICD-10-CM

## 2023-12-12 DIAGNOSIS — F901 Attention-deficit hyperactivity disorder, predominantly hyperactive type: Secondary | ICD-10-CM

## 2023-12-12 NOTE — Progress Notes (Signed)
 Virtual Visit via Video Note  I connected with Shelly Fox on 12/12/23 at  4:15 PM EST by a video enabled telemedicine application and verified that I am speaking with the correct person using two identifiers. Location patient: home Location provider:work office Persons participating in the virtual visit: patient, provider   Patient aware  of the limitations of evaluation and management by telemedicine and  availability of in person appointments. and agreed to proceed.   HPI: Shelly Fox presents for video visit med check  ADDADHD : : had  stopped vyvanse  ofor a few months over summer because of short supply and wanted to see how did okk ,.  Feels med doesn't change personality and completes tasks more efficiently  on meds. Personality some   Anxiety Off citalopram  for a few months did ok but feels better on medication and would like to stay on current dose.  ROS: See pertinent positives and negatives per HPI. 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 Past Medical History:  Diagnosis Date   Attention and concentration deficit    eval for add poss ocd and hyperfocus failed med trial in elem school   Seasonal allergies    rhinitis on meds     Past Surgical History:  Procedure Laterality Date   MOUTH SURGERY     2010, tooth extraction   NO PAST SURGERIES      Family History  Problem Relation Age of Onset   Hyperlipidemia Father    Asthma Father    Allergies Father    Allergies Mother    Alcohol abuse Maternal Grandfather     Social History   Tobacco Use   Smoking status: Some Days    Types: Cigarettes    Passive exposure: Never   Smokeless tobacco: Never   Tobacco comments:    VAPES  Vaping Use   Vaping status: Former  Substance Use Topics   Alcohol use: Yes    Alcohol/week: 0.0 standard drinks of alcohol    Comment: Occassional   Drug use: Yes    Types: Marijuana      Current Outpatient Medications:    cetirizine   (ZYRTEC ) 10 MG tablet, Take 1 tablet (10 mg total) by mouth daily. (Patient taking differently: Take 10 mg by mouth as needed.), Disp: 90 tablet, Rfl: 0   citalopram  (CELEXA ) 20 MG tablet, TAKE 1 TABLET BY MOUTH DAILY, Disp: 90 tablet, Rfl: 1   ibuprofen (ADVIL,MOTRIN) 800 MG tablet, as needed., Disp: , Rfl:    Lisdexamfetamine Dimesylate  (VYVANSE ) 50 MG CHEW, Chew 1 tablet (50 mg total) by mouth daily., Disp: 90 tablet, Rfl: 0   fluticasone  (FLONASE ) 50 MCG/ACT nasal spray, Place 2 sprays into both nostrils daily., Disp: 16 g, Rfl: 0  EXAM: BP Readings from Last 3 Encounters:  09/23/22 (!) 106/58  06/06/22 110/68  04/20/22 102/80    VITALS per patient if applicable:  GENERAL: alert, oriented, appears well and in no acute distress  HEENT: atraumatic, conjunttiva clear, no obvious abnormalities on inspection of external nose and ears  NECK: normal movements of the head and neck  LUNGS: on inspection no signs of respiratory distress, breathing rate appears normal, no obvious gross SOB, gasping or wheezing  CV: no obvious cyanosis  MS: moves all visible extremities without noticeable abnormality  PSYCH/NEURO: pleasant and cooperative, no obvious depression or anxiety, speech and thought processing grossly intact Lab Results  Component Value Date   WBC 7.5  05/29/2019   HGB 12.6 05/29/2019   HCT 37.7 05/29/2019   PLT 246.0 05/29/2019   GLUCOSE 83 05/29/2019   CHOL 159 05/29/2019   TRIG 125.0 05/29/2019   HDL 53.90 05/29/2019   LDLCALC 80 05/29/2019   ALT 15 05/29/2019   AST 29 05/29/2019   NA 138 05/29/2019   K 4.6 05/29/2019   CL 104 05/29/2019   CREATININE 0.95 05/29/2019   BUN 12 05/29/2019   CO2 26 05/29/2019   TSH 2.12 05/29/2019    ASSESSMENT AND PLAN:  Discussed the following assessment and plan:    ICD-10-CM   1. ADHD (attention deficit hyperactivity disorder), predominantly hyperactive impulsive type  F90.1     2. Medication management  Z79.899     3.  Anxiety disorder, unspecified type  F41.9      Med more help thatn down side at this time   request refill when due  Back on citalopram   she feels  helps  but anxiety is  much better than in past.  Since on for 2 mos will continue   at this time. 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   Expectant management and discussion of plan and treatment with opportunity to ask questions and all were answered. The patient agreed with the plan and demonstrated an understanding of the instructions.   Advised to call back or seek an in-person evaluation if worsening  or having  further concerns  in interim. Return in about 6 months (around 06/10/2024).    Apolinar Eastern, MD

## 2024-01-11 ENCOUNTER — Other Ambulatory Visit: Payer: Self-pay | Admitting: Internal Medicine

## 2024-01-29 ENCOUNTER — Other Ambulatory Visit: Payer: Self-pay | Admitting: Internal Medicine

## 2024-01-29 ENCOUNTER — Encounter: Payer: Self-pay | Admitting: Internal Medicine

## 2024-01-29 MED ORDER — LISDEXAMFETAMINE DIMESYLATE 50 MG PO CHEW: 50.0000 mg | CHEWABLE_TABLET | Freq: Every day | ORAL | 0 refills | Status: DC

## 2024-01-30 MED ORDER — LISDEXAMFETAMINE DIMESYLATE 50 MG PO CHEW: 50.0000 mg | CHEWABLE_TABLET | Freq: Every day | ORAL | 0 refills | Status: DC

## 2024-01-31 ENCOUNTER — Telehealth: Payer: Self-pay

## 2024-01-31 MED ORDER — LISDEXAMFETAMINE DIMESYLATE 50 MG PO CHEW: 50.0000 mg | CHEWABLE_TABLET | Freq: Every day | ORAL | 0 refills | Status: DC

## 2024-01-31 NOTE — Addendum Note (Signed)
 Addended byMadelin Headings on: 01/31/2024 01:47 PM   Modules accepted: Orders

## 2024-01-31 NOTE — Telephone Encounter (Signed)
 I sent in a rx yesterday please review record and advise  what needs to be done

## 2024-01-31 NOTE — Telephone Encounter (Signed)
 Rx was sent to [facility] yesterday per pt request. Please see mychart message encounter.   Father, Fayrene Fearing, is not on pt's DPR.   Contacted pt and inform it was sent to the Rx that she had requested.  Pt states she is not sure if they have it and ask if [facility] dad [facility].   Also advise pt to update her DPR when she is in office so we can talk to dad. Pt verbalized understanding.   Offer to contact [facility] pharmacy first and see if they have the Rx. If not, inform pt, this cma will forward to Dr. Fabian Sharp to send the Rx. Pt verbalized understanding.   Will call pharmacy.

## 2024-01-31 NOTE — Telephone Encounter (Signed)
 Designer, fashion/clothing. They reports they don't have any in stock.   [facility] [address] They said they only have 40 pills of the generic and advise to [facility] [address]   Spoke to [place] [facility] [address] She said they have 100 pills.   Follow up with pt. Pt request to send to [facility] [address]   Please [facility] [address]  Rx pended and pharmacy change.

## 2024-01-31 NOTE — Telephone Encounter (Signed)
 Contact pt. Pt is aware. No further action is needed.

## 2024-01-31 NOTE — Telephone Encounter (Signed)
 Copied from CRM [id number]. Topic: Clinical - Medication Question >> Jan 31, 2024  9:58 AM Alcus Dad wrote: Reason for CRM: Patient's father Shelly Fox is calling for daughter. Daughter is currently at work. Father stated that [id number] - [place], [place] - [address] [place] [address] Phone: [phone] Fax: [phone] is out of stock for this medication. Father wants to know if he could get prescription Lisdexamfetamine Dimesylate (VYVANSE) 50 MG CHEW [id number] - [place], [place] - [address] [facility] [address]. Please call Shelly Fox [phone]

## 2024-02-09 ENCOUNTER — Encounter: Payer: Self-pay | Admitting: Orthopedic Surgery

## 2024-02-09 ENCOUNTER — Other Ambulatory Visit: Payer: Self-pay

## 2024-02-09 ENCOUNTER — Ambulatory Visit: Payer: Self-pay | Admitting: Orthopedic Surgery

## 2024-02-09 DIAGNOSIS — M25562 Pain in left knee: Secondary | ICD-10-CM

## 2024-02-09 NOTE — Progress Notes (Signed)
 Office Visit Note   Patient: Shelly Fox           Date of Birth: November 17, 2001           MRN: [id number] Visit Date: 02/09/2024 Requested by: Madelin Headings, MD [address] [place],  [place] [address] PCP: Madelin Headings, MD  Subjective: Chief Complaint  Patient presents with   Left Knee - Pain    HPI: Shelly Fox is a 23 y.o. female who presents to the office reporting 73-month history of lateral sided left knee pain.  She does report some weakness and grinding.  Describes increased pain with running and sometimes with walking.  Denies any specific injury.  She is a [employer] cabin crew.  Likes to workout which does include walking running as well as doing machines.  When she walks she does walk at a fast pace.  Describes fairly sharp pain in the retropatellar tendon region as well as.  The lateral aspect of the knee.  She believes her knee may have swelled on 1 or 2 occasions.  Takes ibuprofen this gives her mild relief.  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              ROS: All systems reviewed are negative as they relate to the chief complaint within the history of present illness.  Patient denies fevers or chills.  Assessment & Plan: Visit Diagnoses:  1. Left knee pain, unspecified chronicity     Plan: Impression is left knee pain lateral sided with possible small meniscal cyst at the lateral joint line.  This is examined with ultrasound and it does show a possible small meniscal cyst.  Based on duration of symptoms and failure of conservative management MRI of the left knee is indicated to evaluate but the retropatellar region which was unremarkable on ultrasound as well as possible lateral meniscal tear associated with the cyst.  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  Follow-Up Instructions: No follow-ups on file.   Orders:  Orders Placed This Encounter  Procedures   XR KNEE 3 VIEW LEFT   No orders of the defined types were placed in this encounter.     Procedures: No procedures performed   Clinical Data: No additional findings.  Objective: Vital Signs: There were no vitals taken for this visit.  Physical Exam:  Constitutional: Patient appears well-developed HEENT:  Head: Normocephalic Eyes:EOM are normal Neck: Normal range of motion Cardiovascular: Normal rate Pulmonary/chest: Effort normal Neurologic: Patient is alert Skin: Skin is warm Psychiatric: Patient has normal mood and affect  Ortho Exam: Ortho exam demonstrates normal gait alignment.  Excellent quad tone bilaterally.  Full range of motion of both knees.  On the left-hand side the collateral and cruciate ligaments are stable.  No patellofemoral crepitus is present.  No discrete tenderness of the patellar tendon at the tibial tubercle or inferior pole patella attachment.  No lymphadenopathy or skin changes noted in that left knee region.  Does have a slight fullness at the lateral joint line just posterior to [name].  Under ultrasound examination there is an apparent small cystic region associated with the periphery of the lateral meniscus.  Plain radiographs no arthritis.  Specialty Comments:  No specialty comments available.  Imaging: XR KNEE 3 VIEW LEFT Result Date: 02/09/2024 AP lateral and merchant radiographs of the left knee are reviewed.  No acute fracture.  No dislocation.  Alignment normal.  No significant degenerative changes in the medial lateral or patellofemoral compartments     PMFS History: Patient Active Problem List   Diagnosis Date Noted   Anxiety disorder 01/15/2018   ADHD (attention deficit hyperactivity disorder), predominantly hyperactive impulsive type 01/15/2018   Seasonal  allergies    Attention and concentration deficit    Wears glasses 04/10/2015   Past Medical History:  Diagnosis Date   Attention and concentration deficit    eval for add poss ocd and hyperfocus failed med trial in elem school   Seasonal allergies    rhinitis on meds     Family History  Problem Relation Age of Onset   Hyperlipidemia Father    Asthma Father    Allergies Father    Allergies Mother    Alcohol abuse Maternal Grandfather     Past Surgical History:  Procedure Laterality Date   MOUTH SURGERY     2010, tooth extraction   NO PAST SURGERIES     Social History   Occupational History   Occupation: Consulting civil engineer  Tobacco Use   Smoking status: Some Days    Types: Cigarettes    Passive exposure: Never   Smokeless tobacco: Never   Tobacco comments:    VAPES  Vaping Use   Vaping status: Former  Substance and Sexual Activity   Alcohol use: Yes    Alcohol/week: 0.0 standard drinks of alcohol    Comment: Occassional   Drug use: Yes    Types: Marijuana   Sexual activity: Never    Partners: Male

## 2024-02-12 ENCOUNTER — Other Ambulatory Visit: Payer: Self-pay

## 2024-02-12 DIAGNOSIS — M25562 Pain in left knee: Secondary | ICD-10-CM

## 2024-02-17 ENCOUNTER — Ambulatory Visit
Admission: RE | Admit: 2024-02-17 | Discharge: 2024-02-17 | Disposition: A | Discharge status: Home / Self Care | Payer: Self-pay | Source: Ambulatory Visit | Attending: Orthopedic Surgery | Admitting: Orthopedic Surgery

## 2024-02-17 DIAGNOSIS — M25562 Pain in left knee: Secondary | ICD-10-CM

## 2024-02-27 ENCOUNTER — Other Ambulatory Visit: Payer: Self-pay | Admitting: Orthopedic Surgery

## 2024-03-04 ENCOUNTER — Other Ambulatory Visit: Payer: Self-pay | Admitting: Surgical

## 2024-03-04 ENCOUNTER — Encounter: Payer: Self-pay | Admitting: Orthopedic Surgery

## 2024-03-04 DIAGNOSIS — M23062 Cystic meniscus, other lateral meniscus, left knee: Secondary | ICD-10-CM

## 2024-03-04 DIAGNOSIS — S83262A Peripheral tear of lateral meniscus, current injury, left knee, initial encounter: Secondary | ICD-10-CM

## 2024-03-04 MED ORDER — OXYCODONE HCL 5 MG PO TABS: 5.0000 mg | ORAL_TABLET | ORAL | 0 refills | Status: DC | PRN

## 2024-03-04 MED ORDER — CELECOXIB 100 MG PO CAPS
100.0000 mg | ORAL_CAPSULE | Freq: Two times a day (BID) | ORAL | 0 refills | Status: DC
Start: 2024-03-04 — End: 2024-04-24

## 2024-03-04 MED ORDER — METHOCARBAMOL 500 MG PO TABS: 500.0000 mg | ORAL_TABLET | Freq: Three times a day (TID) | ORAL | 1 refills | Status: DC | PRN

## 2024-03-04 MED ORDER — ASPIRIN 81 MG PO CHEW: 81.0000 mg | CHEWABLE_TABLET | Freq: Two times a day (BID) | ORAL | 0 refills | Status: AC

## 2024-03-11 ENCOUNTER — Encounter: Payer: Self-pay | Admitting: Orthopedic Surgery

## 2024-03-13 ENCOUNTER — Ambulatory Visit (INDEPENDENT_AMBULATORY_CARE_PROVIDER_SITE_OTHER): Payer: Self-pay | Admitting: Orthopedic Surgery

## 2024-03-13 DIAGNOSIS — M25562 Pain in left knee: Secondary | ICD-10-CM

## 2024-03-15 ENCOUNTER — Encounter: Payer: Self-pay | Admitting: Orthopedic Surgery

## 2024-03-15 NOTE — Progress Notes (Signed)
   Post-Op Visit Note   Patient: Shelly Fox           Date of Birth: Jun 24, 2001           MRN: [id number] Visit Date: 03/13/2024 PCP: Reginal Capra, MD   Assessment & Plan:  Chief Complaint:  Chief Complaint  Patient presents with   Left Knee - Routine Post Op    LEFT KNEE SCOPE CYST DEBRIDEMENT (surgery date 03-04-24)   Visit Diagnoses:  1. Left knee pain, unspecified chronicity     Plan: Shelly is now about 9 days out left knee arthroscopy and debridement of the meniscus cysts.  She had 4 sutures placed to close of the cleft on the anterior aspect of the lateral meniscus.  On examination no calf tenderness and negative Homans.  Mild effusion is present which is aspirated today.  Some of the sutures are removed but we left 1 in each portal because of slight gapping.  Plan is to see her back early next week for suture removal.  She may try to do that on her own but I advised her to send me pictures before she did.  Walking only with nothing more for exercise until these portals have healed up.  Follow-Up Instructions: No follow-ups on file.   Orders:  No orders of the defined types were placed in this encounter.  No orders of the defined types were placed in this encounter.   Imaging: No results found.  PMFS History: Patient Active Problem List   Diagnosis Date Noted   Anxiety disorder 01/15/2018   ADHD (attention deficit hyperactivity disorder), predominantly hyperactive impulsive type 01/15/2018   Seasonal allergies    Attention and concentration deficit    Wears glasses 04/10/2015   Past Medical History:  Diagnosis Date   Attention and concentration deficit    eval for add poss ocd and hyperfocus failed med trial in elem school   Seasonal allergies    rhinitis on meds     Family History  Problem Relation Age of Onset   Hyperlipidemia Father    Asthma Father    Allergies Father    Allergies Mother    Alcohol abuse Maternal Grandfather     Past Surgical  History:  Procedure Laterality Date   MOUTH SURGERY     2010, tooth extraction   NO PAST SURGERIES     Social History   Occupational History   Occupation: Consulting civil engineer  Tobacco Use   Smoking status: Some Days    Types: Cigarettes    Passive exposure: Never   Smokeless tobacco: Never   Tobacco comments:    VAPES  Vaping Use   Vaping status: Former  Substance and Sexual Activity   Alcohol use: Yes    Alcohol/week: 0.0 standard drinks of alcohol    Comment: Occassional   Drug use: Yes    Types: Marijuana   Sexual activity: Never    Partners: Male

## 2024-04-03 ENCOUNTER — Encounter: Payer: Self-pay | Admitting: Orthopedic Surgery

## 2024-04-03 ENCOUNTER — Ambulatory Visit (INDEPENDENT_AMBULATORY_CARE_PROVIDER_SITE_OTHER): Payer: Self-pay | Admitting: Orthopedic Surgery

## 2024-04-03 DIAGNOSIS — M25562 Pain in left knee: Secondary | ICD-10-CM

## 2024-04-03 NOTE — Progress Notes (Signed)
   Post-Op Visit Note   Patient: Shelly Fox           Date of Birth: 04-17-2001           MRN: [id number] Visit Date: 04/03/2024 PCP: Reginal Capra, MD   Assessment & Plan:  Chief Complaint:  Chief Complaint  Patient presents with   Left Knee - Follow-up   Visit Diagnoses:  1. Left knee pain, unspecified chronicity     Plan: Patient is now 4 weeks out left knee arthroscopy with cyst decompression and anterior meniscal repair on the lateral side.  On exam she has mild effusion which is less than what she had before.  She has good range of motion and is weightbearing as tolerated at this time.  Portals have healed nicely.  Plan at this time is hold off on aspiration today.  She is taking ibuprofen occasionally.  4-week return for clinical recheck.  Okay for stationary bike for exercise but other than that I think walking is the most aggressive thing that I want her doing in terms of loadbearing for the next 4 weeks.  Follow-Up Instructions: No follow-ups on file.   Orders:  No orders of the defined types were placed in this encounter.  No orders of the defined types were placed in this encounter.   Imaging: No results found.  PMFS History: Patient Active Problem List   Diagnosis Date Noted   Anxiety disorder 01/15/2018   ADHD (attention deficit hyperactivity disorder), predominantly hyperactive impulsive type 01/15/2018   Seasonal allergies    Attention and concentration deficit    Wears glasses 04/10/2015   Past Medical History:  Diagnosis Date   Attention and concentration deficit    eval for add poss ocd and hyperfocus failed med trial in elem school   Seasonal allergies    rhinitis on meds     Family History  Problem Relation Age of Onset   Hyperlipidemia Father    Asthma Father    Allergies Father    Allergies Mother    Alcohol abuse Maternal Grandfather     Past Surgical History:  Procedure Laterality Date   MOUTH SURGERY     2010, tooth extraction    NO PAST SURGERIES     Social History   Occupational History   Occupation: Consulting civil engineer  Tobacco Use   Smoking status: Some Days    Types: Cigarettes    Passive exposure: Never   Smokeless tobacco: Never   Tobacco comments:    VAPES  Vaping Use   Vaping status: Former  Substance and Sexual Activity   Alcohol use: Yes    Alcohol/week: 0.0 standard drinks of alcohol    Comment: Occassional   Drug use: Yes    Types: Marijuana   Sexual activity: Never    Partners: Male

## 2024-04-24 ENCOUNTER — Encounter: Payer: Self-pay | Admitting: Internal Medicine

## 2024-04-24 ENCOUNTER — Telehealth (INDEPENDENT_AMBULATORY_CARE_PROVIDER_SITE_OTHER): Payer: Self-pay | Admitting: Internal Medicine

## 2024-04-24 VITALS — Ht 66.5 in | Wt 143.0 lb

## 2024-04-24 DIAGNOSIS — Z79899 Other long term (current) drug therapy: Secondary | ICD-10-CM

## 2024-04-24 DIAGNOSIS — F901 Attention-deficit hyperactivity disorder, predominantly hyperactive type: Secondary | ICD-10-CM

## 2024-04-24 MED ORDER — LISDEXAMFETAMINE DIMESYLATE 50 MG PO CHEW: 50.0000 mg | CHEWABLE_TABLET | Freq: Every day | ORAL | 0 refills | Status: DC

## 2024-04-24 NOTE — Progress Notes (Signed)
 Virtual Visit via Video Note  I connected with Shelly Fox on 04/24/24 at  4:00 PM EDT by a video enabled telemedicine application and verified that I am speaking with the correct person using two identifiers. Location patient: vehicle  Location provider:work office Persons participating in the virtual visit: patient, provider   Patient aware  of the limitations of evaluation and management by telemedicine and  availability of in person appointments. and agreed to proceed.   HPI: Shelly Fox presents for video visit for med check add adhd and citalopram   Still using vyvanse  and feels helpful smoother   during day although forgot today . Still on citalopram   to remain on . Neg td   Sleep 7-9 goal usually 6-8  Periods  reg 3-5 days. 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    HH  of 2 lives with   friend  2 dogs   Had knee surgery for partial m tear April  no current meds from that  time  ROS: See pertinent positives and negatives per HPI.  Past Medical History:  Diagnosis Date   Attention and concentration deficit    eval for add poss ocd and hyperfocus failed med trial in elem school   Seasonal allergies    rhinitis on meds     Past Surgical History:  Procedure Laterality Date   MOUTH SURGERY     2010, tooth extraction   NO PAST SURGERIES      Family History  Problem Relation Age of Onset   Hyperlipidemia Father    Asthma Father    Allergies Father    Allergies Mother    Alcohol abuse Maternal Grandfather     Social History   Tobacco Use   Smoking status: Some Days    Types: Cigarettes    Passive exposure: Never   Smokeless tobacco: Never   Tobacco comments:    VAPES  Vaping Use   Vaping status: Former  Substance Use Topics   Alcohol use: Yes    Alcohol/week: 0.0 standard drinks of alcohol    Comment: Occassional   Drug use: Yes    Types: Marijuana      Current Outpatient Medications:    cetirizine  (ZYRTEC )  10 MG tablet, Take 1 tablet (10 mg total) by mouth daily. (Patient taking differently: Take 10 mg by mouth as needed.), Disp: 90 tablet, Rfl: 0   citalopram  (CELEXA ) 20 MG tablet, TAKE 1 TABLET BY MOUTH DAILY, Disp: 90 tablet, Rfl: 1   fluticasone  (FLONASE ) 50 MCG/ACT nasal spray, Place 2 sprays into both nostrils daily., Disp: 16 g, Rfl: 0   ibuprofen (ADVIL,MOTRIN) 800 MG tablet, as needed. (Patient not taking: Reported on 04/24/2024), Disp: , Rfl:    Lisdexamfetamine Dimesylate  (VYVANSE ) 50 MG CHEW, Chew 1 tablet (50 mg total) by mouth daily., Disp: 90 tablet, Rfl: 0  EXAM: BP Readings from Last 3 Encounters:  09/23/22 (!) 106/58  06/06/22 110/68  04/20/22 102/80    VITALS per patient if applicable:  GENERAL: alert, oriented, appears well and in no acute distress  HEENT: atraumatic, conjunttiva clear, no obvious abnormalities on inspection of external nose and ears  NECK: normal movements of the head and neck  LUNGS: on inspection no signs of respiratory distress, breathing rate appears normal, no obvious gross SOB, gasping or wheezing  CV: no obvious cyanosis  MS: moves all visible extremities without noticeable abnormality  PSYCH/NEURO: pleasant and cooperative, no obvious  depression or anxiety, speech and thought processing grossly intact Lab Results  Component Value Date   WBC 7.5 05/29/2019   HGB 12.6 05/29/2019   HCT 37.7 05/29/2019   PLT 246.0 05/29/2019   GLUCOSE 83 05/29/2019   CHOL 159 05/29/2019   TRIG 125.0 05/29/2019   HDL 53.90 05/29/2019   LDLCALC 80 05/29/2019   ALT 15 05/29/2019   AST 29 05/29/2019   NA 138 05/29/2019   K 4.6 05/29/2019   CL 104 05/29/2019   CREATININE 0.95 05/29/2019   BUN 12 05/29/2019   CO2 26 05/29/2019   TSH 2.12 05/29/2019    ASSESSMENT AND PLAN:  Discussed the following assessment and plan:    ICD-10-CM   1. ADHD (attention deficit hyperactivity disorder), predominantly hyperactive impulsive type  F90.1 Lisdexamfetamine  Dimesylate (VYVANSE ) 50 MG CHEW    2. Medication management  Z79.899 Lisdexamfetamine Dimesylate  (VYVANSE ) 50 MG CHEW    Benefit more than risk of medications  to continue. 6 mos med check   Or as needed  Counseled.   Expectant management and discussion of plan and treatment with opportunity to ask questions and all were answered. The patient agreed with the plan and demonstrated an understanding of the instructions.   Advised to call back or seek an in-person evaluation if worsening  or having  further concerns  in interim. No follow-ups on file.    Daphine Eagle, MD

## 2024-05-17 ENCOUNTER — Encounter: Payer: Self-pay | Admitting: Internal Medicine

## 2024-05-21 NOTE — Telephone Encounter (Signed)
 I dont usually advise   prednisone  except for  asthma flares  and not infection per se. And avoid  routine antibiotic in [place] travel. as risk of  bacterial infection is not higher than at home.  Need more information about your concerns    Check the cdc travel web site to make sure immunizations and other advise is considered.

## 2024-07-10 ENCOUNTER — Telehealth: Payer: Self-pay | Admitting: Internal Medicine

## 2024-07-10 NOTE — Telephone Encounter (Signed)
 Copied from CRM [id number]. Topic: Appointments - Scheduling Inquiry for Clinic >> Jul 10, 2024  3:38 PM Lavanda D wrote: Reason for CRM: Patient just got a Hep B tider and was told she was negative for the antibodies, she would like to get the booster.

## 2024-07-11 NOTE — Telephone Encounter (Signed)
 Ok to give hep b bas a booster

## 2024-08-12 ENCOUNTER — Encounter: Payer: Self-pay | Admitting: Internal Medicine

## 2024-08-19 ENCOUNTER — Other Ambulatory Visit: Payer: Self-pay | Admitting: Internal Medicine

## 2024-08-19 NOTE — Telephone Encounter (Unsigned)
 Copied from CRM [id number]. Topic: Clinical - Medication Refill >> Aug 19, 2024  4:18 PM Viola F wrote: Medication:  lisdexamfetamine (VYVANSE ) 50 MG capsule - 90 day supply  citalopram  (CELEXA ) 20 MG tablet - 90 supply   Has the patient contacted their pharmacy? Yes (Agent: If no, request that the patient contact the pharmacy for the refill. If patient does not wish to contact the pharmacy document the reason why and proceed with request.) (Agent: If yes, when and what did the pharmacy advise?)  This is the patient's preferred pharmacy:   [id number] [place], [place] - [address] [place] [address] Phone: [phone] Fax: [phone]  Is this the correct pharmacy for this prescription? Yes If no, delete pharmacy and type the correct one.   Has the prescription been filled recently? Yes  Is the patient out of the medication? No  Has the patient been seen for an appointment in the last year OR does the patient have an upcoming appointment? Yes  Can we respond through MyChart? Yes  Agent: Please be advised that Rx refills may take up to 3 business days. We ask that you follow-up with your pharmacy.

## 2024-08-20 MED ORDER — LISDEXAMFETAMINE DIMESYLATE 50 MG PO CAPS: 50.0000 mg | ORAL_CAPSULE | Freq: Every day | ORAL | 0 refills | Status: DC

## 2024-08-21 MED ORDER — CITALOPRAM HYDROBROMIDE 20 MG PO TABS: 20.0000 mg | ORAL_TABLET | Freq: Every day | ORAL | 1 refills | Status: DC

## 2024-09-30 ENCOUNTER — Encounter: Payer: Self-pay | Admitting: Radiology

## 2024-10-29 ENCOUNTER — Telehealth: Payer: Self-pay | Admitting: Internal Medicine

## 2024-10-29 VITALS — Ht 66.5 in | Wt 142.0 lb

## 2024-10-29 DIAGNOSIS — F419 Anxiety disorder, unspecified: Secondary | ICD-10-CM

## 2024-10-29 DIAGNOSIS — Z79899 Other long term (current) drug therapy: Secondary | ICD-10-CM

## 2024-10-29 DIAGNOSIS — F901 Attention-deficit hyperactivity disorder, predominantly hyperactive type: Secondary | ICD-10-CM

## 2024-10-29 MED ORDER — CITALOPRAM HYDROBROMIDE 20 MG PO TABS: 20.0000 mg | ORAL_TABLET | Freq: Every day | ORAL | 1 refills | Status: DC

## 2024-10-29 MED ORDER — LISDEXAMFETAMINE DIMESYLATE 50 MG PO CAPS: 50.0000 mg | ORAL_CAPSULE | Freq: Every day | ORAL | 0 refills | Status: DC

## 2024-10-29 NOTE — Progress Notes (Signed)
 Virtual Visit via Video Note  I connected with Shelly Fox on 10/29/24 at  4:00 PM EST by a video enabled telemedicine application and verified that I am speaking with the correct person using two identifiers. Location patient: home Location provider:work office Persons participating in the virtual visit: patient, provider   Patient aware  of the limitations of evaluation and management by telemedicine and  availability of in person appointments. and agreed to proceed.   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  Yoga weekly  Studies   and takes vyvanse  daily  50 mg now on capsules cause less expensive than chewables    ROS: See pertinent positives and negatives per HPI.  Past Medical History:  Diagnosis Date   Attention and concentration deficit    eval for add poss ocd and hyperfocus failed med trial in elem school   Seasonal allergies    rhinitis on meds     Past Surgical History:  Procedure Laterality Date   MOUTH SURGERY     2010, tooth extraction   NO PAST SURGERIES      Family History  Problem Relation Age of Onset   Hyperlipidemia Father    Asthma Father    Allergies Father    Allergies Mother    Alcohol abuse Maternal Grandfather     Social History   Tobacco Use   Smoking status: Some Days    Types: Cigarettes    Passive exposure: Never   Smokeless tobacco: Never   Tobacco comments:    VAPES  Vaping Use   Vaping status: Former  Substance Use Topics   Alcohol use: Yes    Alcohol/week: 0.0 standard drinks of alcohol    Comment: Occassional   Drug use: Yes    Types: Marijuana      Current Outpatient Medications:    cetirizine  (ZYRTEC ) 10 MG tablet, Take 1 tablet (10 mg total) by mouth daily. (Patient taking differently: Take 10 mg by mouth as needed.), Disp: 90 tablet,  Rfl: 0   fluticasone  (FLONASE ) 50 MCG/ACT nasal spray, Place 2 sprays into both nostrils daily., Disp: 16 g, Rfl: 0   citalopram  (CELEXA ) 20 MG tablet, Take 1 tablet (20 mg total) by mouth daily., Disp: 90 tablet, Rfl: 1   ibuprofen (ADVIL,MOTRIN) 800 MG tablet, as needed. (Patient not taking: Reported on 10/29/2024), Disp: , Rfl:    lisdexamfetamine (VYVANSE ) 50 MG capsule, Take 1 capsule (50 mg total) by mouth daily., Disp: 90 capsule, Rfl: 0  EXAM: BP Readings from Last 3 Encounters:  09/23/22 (!) 106/58  06/06/22 110/68  04/20/22 102/80    VITALS per patient if applicable:  GENERAL: alert, oriented, appears well and in no acute distress  HEENT: atraumatic, conjunttiva clear, no obvious abnormalities on inspection of external nose and ears  NECK: normal movements of the head and neck  LUNGS: on inspection no signs of respiratory distress, breathing rate appears normal, no obvious gross SOB, gasping or wheezing  CV: no obvious cyanosis  MS: moves all visible extremities without noticeable abnormality  PSYCH/NEURO: pleasant and cooperative, no obvious depression or anxiety, speech and thought processing grossly intact Lab Results  Component Value Date   WBC 7.5 05/29/2019   HGB 12.6 05/29/2019   HCT 37.7 05/29/2019  PLT 246.0 05/29/2019   GLUCOSE 83 05/29/2019   CHOL 159 05/29/2019   TRIG 125.0 05/29/2019   HDL 53.90 05/29/2019   LDLCALC 80 05/29/2019   ALT 15 05/29/2019   AST 29 05/29/2019   NA 138 05/29/2019   K 4.6 05/29/2019   CL 104 05/29/2019   CREATININE 0.95 05/29/2019   BUN 12 05/29/2019   CO2 26 05/29/2019   TSH 2.12 05/29/2019    ASSESSMENT AND PLAN:  Discussed the following assessment and plan:    ICD-10-CM   1. ADHD (attention deficit hyperactivity disorder), predominantly hyperactive impulsive type  F90.1     2. Anxiety disorder, unspecified type  F41.9     3. Medication management  Z79.899      Reviewed medication s  benefit more than risk   Attention to optimized sleep nutrition and outside time.  Counseled.   Expectant management and discussion of plan and treatment with opportunity to ask questions and all were answered. The patient agreed with the plan and demonstrated an understanding of the instructions.   Advised to call back or seek an in-person evaluation if worsening  or having  further concerns  in interim. Return in about 6 months (around 04/29/2025).   Apolinar Eastern, MD

## 2024-11-20 ENCOUNTER — Other Ambulatory Visit: Payer: Self-pay | Admitting: Internal Medicine

## 2024-11-20 NOTE — Telephone Encounter (Unsigned)
 Copied from CRM #[id number]. Topic: Clinical - Medication Refill >> Nov 20, 2024 11:51 AM [name] wrote: Medication: lisdexamfetamine  (VYVANSE ) 50 MG capsule citalopram  (CELEXA ) 20 MG tablet  Has the patient contacted their pharmacy? Yes (Agent: If no, request that the patient contact the pharmacy for the refill. If patient does not wish to contact the pharmacy document the reason why and proceed with request.) (Agent: If yes, when and what did the pharmacy advise?)  This is the patient's preferred pharmacy:  [facility] Address: [address] [place], [place] [address] Phone: [phone]  Is this the correct pharmacy for this prescription? Yes If no, delete pharmacy and type the correct one.   Has the prescription been filled recently? No  Is the patient out of the medication? Yes  Has the patient been seen for an appointment in the last year OR does the patient have an upcoming appointment? Yes  Can we respond through MyChart? Yes  Agent: Please be advised that Rx refills may take up to 3 business days. We ask that you follow-up with your pharmacy.

## 2024-11-26 NOTE — Telephone Encounter (Signed)
 Contacted pt's pharmacy in [place] and spoke to [place]. She informed pt's Rx is ready for pt to be pick up but co-pay is $326 . With discount card $221.49.  after checking, inform her looks like pt is requesting to send [facility] to [facility] in [address]onal states they don't transfer to another [facility] and would have new Rx send in.   Contacted pt. Pt inform she didn't pick up the Rx from [place] due to it not ready before she heading home for the holiday. Pt states she picked up the citalopram . Inform pt of information above. Ask pt to contact her pharmacy to cancel the Rx and this cma will forward to Dr. Charlett address the Rx to [address]. Pt verbalized understanding.   Please send Vyvanse  to [address]

## 2024-11-27 MED ORDER — LISDEXAMFETAMINE DIMESYLATE 50 MG PO CAPS: 50.0000 mg | ORAL_CAPSULE | Freq: Every day | ORAL | 0 refills | Status: AC

## 2024-11-27 MED ORDER — CITALOPRAM HYDROBROMIDE 20 MG PO TABS: 20.0000 mg | ORAL_TABLET | Freq: Every day | ORAL | 1 refills | Status: AC

## 2025-01-07 ENCOUNTER — Ambulatory Visit: Payer: Self-pay | Admitting: Internal Medicine

## 2025-01-08 ENCOUNTER — Ambulatory Visit: Payer: Self-pay | Admitting: Internal Medicine
